# Patient Record
Sex: Male | Born: 1975 | Race: White | Hispanic: No | Marital: Married | Smoking: Never smoker
Health system: Southern US, Community
[De-identification: ages and names within clinical notes are randomized; demographics above are authoritative.]

## PROBLEM LIST (undated history)

## (undated) DIAGNOSIS — T7840XA Allergy, unspecified, initial encounter: Secondary | ICD-10-CM

## (undated) DIAGNOSIS — J309 Allergic rhinitis, unspecified: Secondary | ICD-10-CM

## (undated) DIAGNOSIS — J45909 Unspecified asthma, uncomplicated: Secondary | ICD-10-CM

## (undated) DIAGNOSIS — N200 Calculus of kidney: Secondary | ICD-10-CM

## (undated) DIAGNOSIS — R002 Palpitations: Secondary | ICD-10-CM

## (undated) HISTORY — DX: Allergy, unspecified, initial encounter: T78.40XA

## (undated) HISTORY — DX: Unspecified asthma, uncomplicated: J45.909

## (undated) HISTORY — DX: Calculus of kidney: N20.0

## (undated) HISTORY — PX: DERMOID CYST  EXCISION: SHX1452

## (undated) HISTORY — DX: Palpitations: R00.2

## (undated) HISTORY — DX: Allergic rhinitis, unspecified: J30.9

---

## 2010-04-19 ENCOUNTER — Ambulatory Visit: Payer: Self-pay | Admitting: Family Medicine

## 2011-07-01 IMAGING — US ABDOMEN ULTRASOUND LIMITED
1 series · 17 of 25 positions shown · non-contrast
Comparison: none

REASON FOR EXAM: persistent elevations of liver enzymes
COMMENTS:

[Series 1: abdomen ultrasound limited · 17 of 80 slices shown]
[im 1/80]
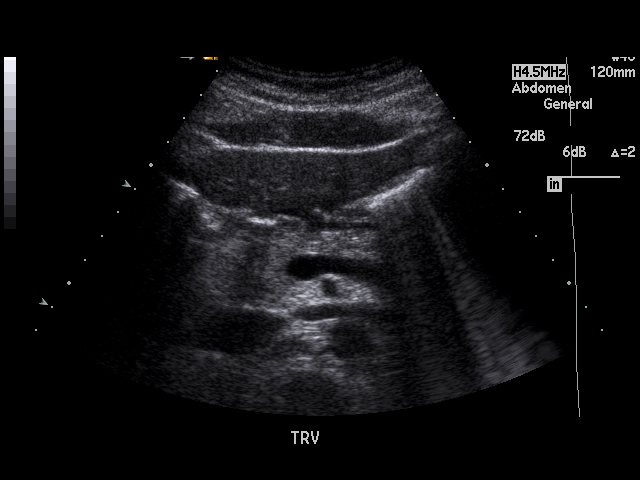
[im 7/80]
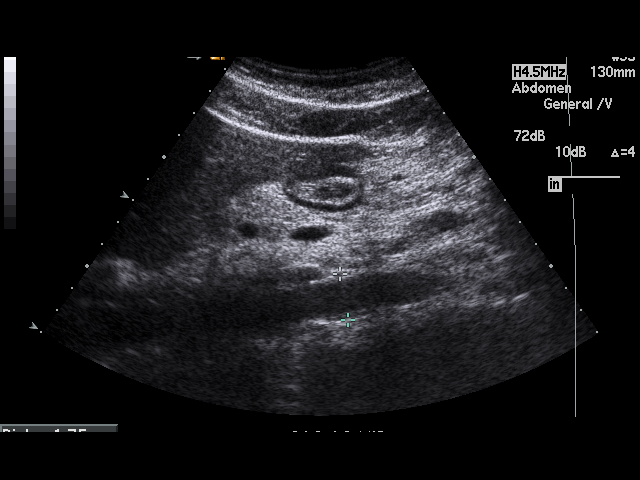
[im 10/80]
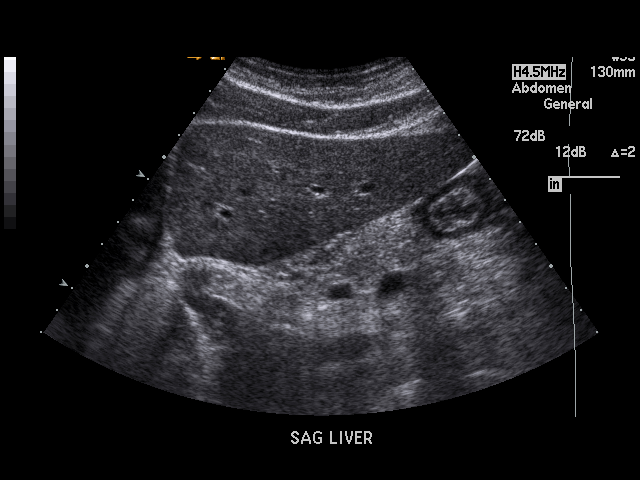
[im 17/80]
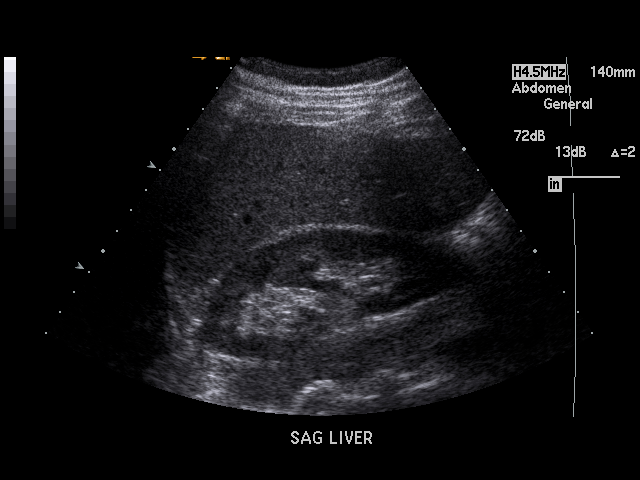
[im 20/80]
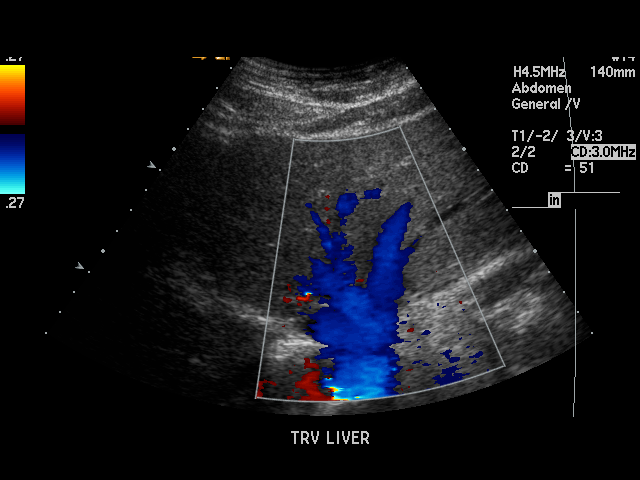
[im 27/80]
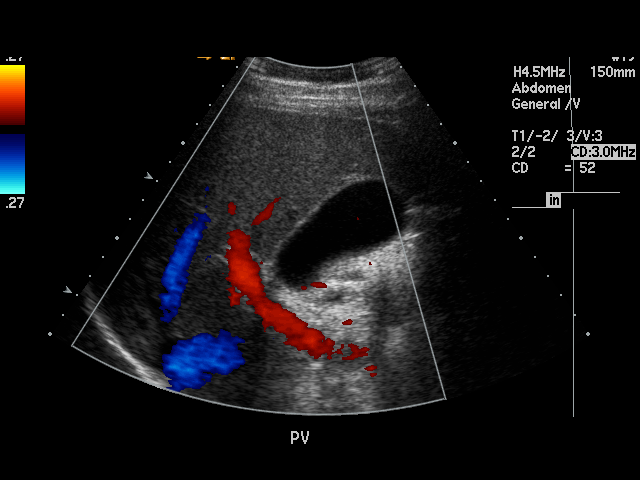
[im 30/80]
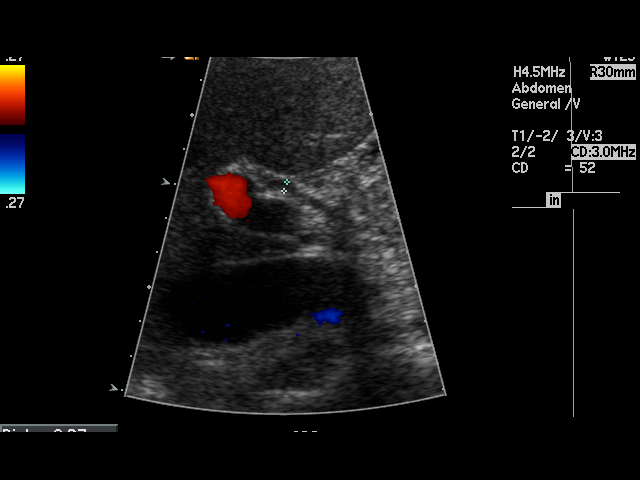
[im 37/80]
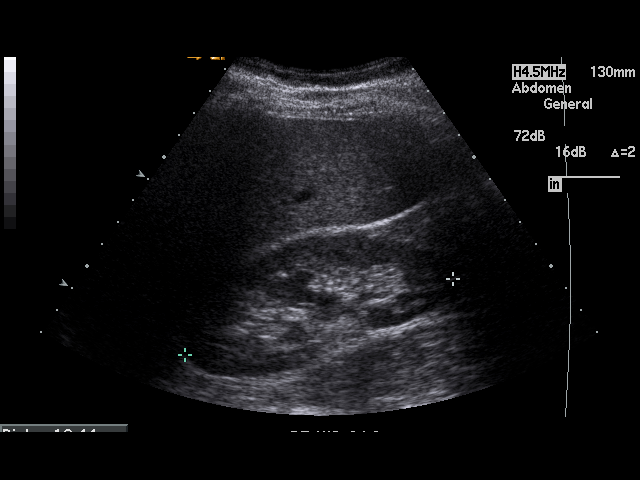
[im 40/80]
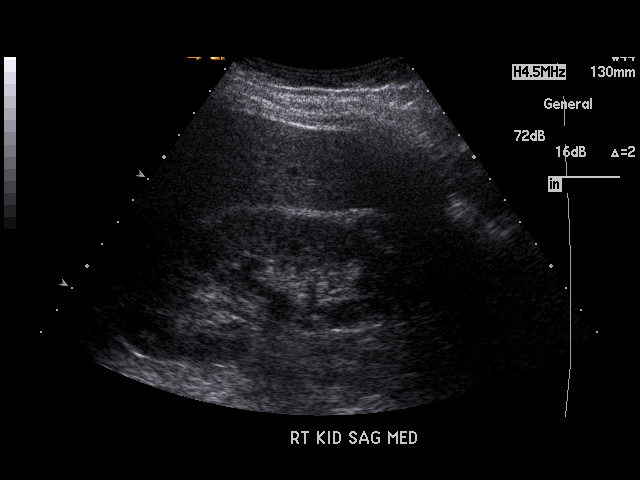
[im 43/80]
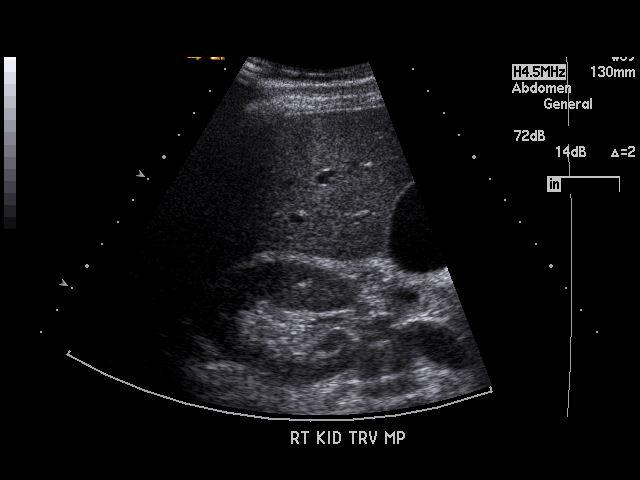
[im 50/80]
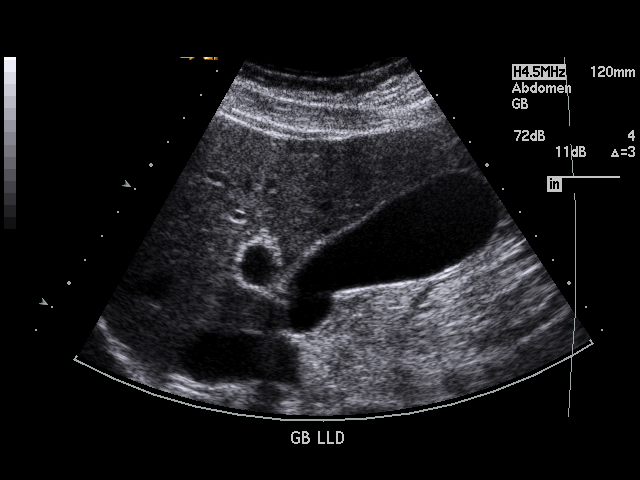
[im 53/80]
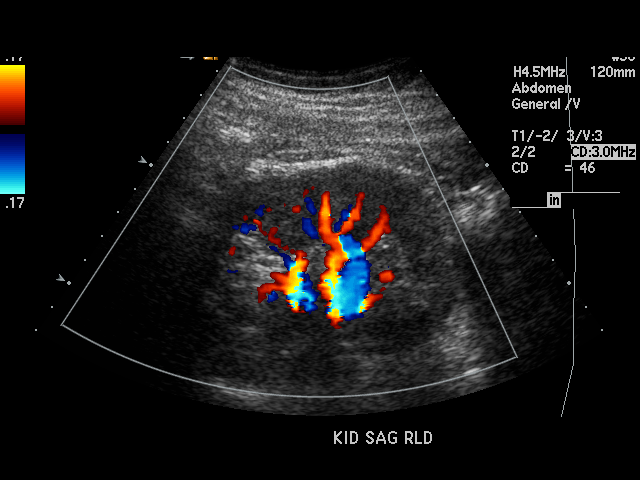
[im 60/80]
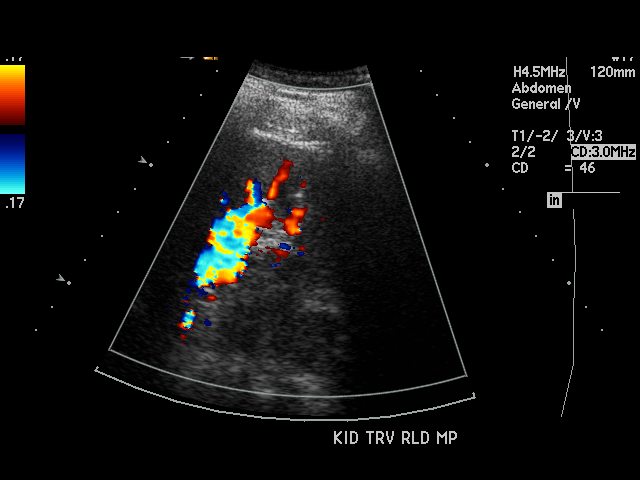
[im 63/80]
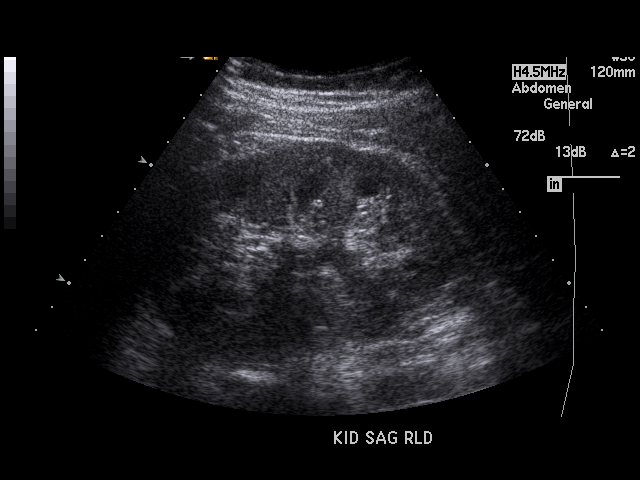
[im 70/80]
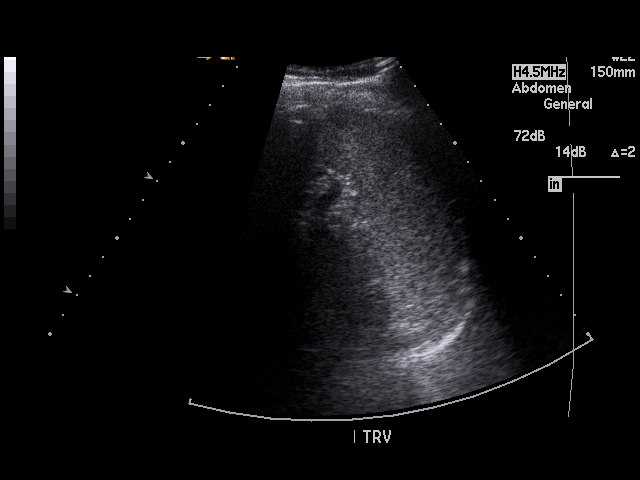
[im 73/80]
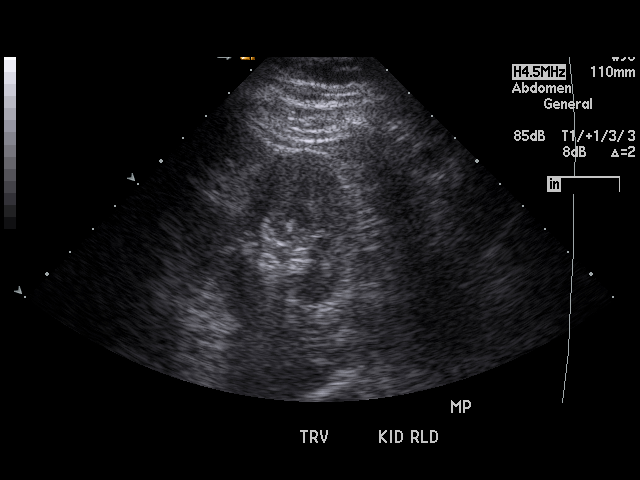
[im 80/80]
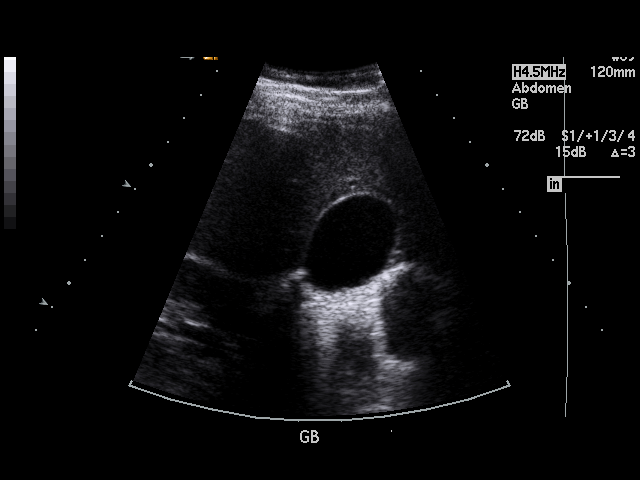

[17 of 25 positions shown; findings below may reference images not displayed]

PROCEDURE:     TIGER - TIGER ABDOMEN UPPER GENERAL  - April 19, 2010 [DATE]

RESULT:     The liver, spleen, abdominal aorta and inferior vena cava show
no significant abnormalities. A portion of the pancreatic tail is obscured
by bowel gas but otherwise the pancreas is normal in appearance. No
gallstones are seen. There is no thickening of the gallbladder wall. The
kidneys show no hydronephrosis. Sagittally, the right kidney measures
cm and the left measures 11.06 cm. No ascites is seen.
IMPRESSION: 1.     No significant abnormalities are noted.

## 2016-09-26 ENCOUNTER — Encounter: Payer: Self-pay | Admitting: Family Medicine

## 2016-09-26 ENCOUNTER — Ambulatory Visit (INDEPENDENT_AMBULATORY_CARE_PROVIDER_SITE_OTHER): Payer: BLUE CROSS/BLUE SHIELD | Admitting: Family Medicine

## 2016-09-26 VITALS — BP 110/82 | HR 67 | Temp 98.3°F | Ht 72.0 in | Wt 157.8 lb

## 2016-09-26 DIAGNOSIS — L989 Disorder of the skin and subcutaneous tissue, unspecified: Secondary | ICD-10-CM

## 2016-09-26 DIAGNOSIS — R7989 Other specified abnormal findings of blood chemistry: Secondary | ICD-10-CM

## 2016-09-26 DIAGNOSIS — R002 Palpitations: Secondary | ICD-10-CM

## 2016-09-26 DIAGNOSIS — J309 Allergic rhinitis, unspecified: Secondary | ICD-10-CM

## 2016-09-26 DIAGNOSIS — E559 Vitamin D deficiency, unspecified: Secondary | ICD-10-CM | POA: Diagnosis not present

## 2016-09-26 DIAGNOSIS — R945 Abnormal results of liver function studies: Principal | ICD-10-CM

## 2016-09-26 LAB — HEPATIC FUNCTION PANEL
ALK PHOS: 63 U/L (ref 39–117)
ALT: 47 U/L (ref 0–53)
AST: 51 U/L — ABNORMAL HIGH (ref 0–37)
Albumin: 4.7 g/dL (ref 3.5–5.2)
BILIRUBIN DIRECT: 0.1 mg/dL (ref 0.0–0.3)
TOTAL PROTEIN: 7.9 g/dL (ref 6.0–8.3)
Total Bilirubin: 0.8 mg/dL (ref 0.2–1.2)

## 2016-09-26 LAB — IRON AND TIBC
%SAT: 70 % — ABNORMAL HIGH (ref 15–60)
Iron: 191 ug/dL — ABNORMAL HIGH (ref 50–180)
TIBC: 271 ug/dL (ref 250–425)
UIBC: 80 ug/dL

## 2016-09-26 LAB — TRANSFERRIN: TRANSFERRIN: 218 mg/dL (ref 212.0–360.0)

## 2016-09-26 LAB — FERRITIN: Ferritin: 353 ng/mL — ABNORMAL HIGH (ref 22.0–322.0)

## 2016-09-26 MED ORDER — FLUTICASONE PROPIONATE 50 MCG/ACT NA SUSP: 2.0000 | Freq: Every day | NASAL | 6 refills | Status: DC

## 2016-09-26 NOTE — Assessment & Plan Note (Signed)
Patient with very infrequent palpitations that he describes as a fluttering that lasts up to a second. Resolves on its own. No other symptoms with it. Suspect PVCs given short duration and description. They do not happen frequently enough for them to be effectively evaluated with Holter monitor or event monitor. They're not bothersome to the patient. Discussed monitoring them and if they become more frequent or if they are persistent he should be reevaluated.

## 2016-09-26 NOTE — Assessment & Plan Note (Signed)
Chronic intermittent elevation. Had an ultrasound previously that was unremarkable. Discussed obtaining lab work as outlined below to evaluate for other potential causes. We'll recheck LFTs as well.

## 2016-09-26 NOTE — Assessment & Plan Note (Signed)
Patient recently started on multivitamin. He'll continue this and will plan on rechecking in 2 months.

## 2016-09-26 NOTE — Progress Notes (Signed)
[name], MD Phone: [phone]  Cole Lane is a 41 y.o. male who presents today for new patient visit.  Patient notes for the last 6-7 years he's had issues with intermittent nasal and sinus congestion. Does note some mild postnasal drip at times. No ear discomfort. Not blowing anything out of his nose. Some sneezing. Does note allergic conjunctivitis in the spring. He notes he can feel the congestion move if he tilts his head. He notes some sinus headaches related to this. Notes a history of a deviated septum. Only has headaches 1-2 times a month. No numbness or weakness. He has not tried any medications other than Claritin just in the spring. He does prop his head up at night and this helps.  History of elevated LFTs: Patient notes intermittently for some time now his LFTs have been slightly elevated. Sometimes they will be normal and sometimes will be slightly elevated. He had an ultrasound 5-6 years ago that was unremarkable. He notes no alcohol intake or Tylenol use.  Patient notes for most of his life he will rarely feel a flutter in his chest that will last half a second to 1 second. Occurs about every 6 months. It does not persist when it does occur. Has not worsened. He notes no chest pain with this. Denies trouble breathing. No other symptoms with it. He had an EKG 2-3 years ago when evaluated at [facility] walk-in clinic for what appears to be bronchitis and the EKG read appears to be normal sinus rhythm without ST or T-wave changes. He's also had intermittent lab work that has not revealed a specific cause for this.  Patient notes for the last 2 years he's noted some issues with bumps on his face. They become more prevalent with time. They do itch occasionally.  Active Ambulatory Problems    Diagnosis Date Noted  . Elevated LFTs 09/26/2016  . Skin lesions 09/26/2016  . Palpitations 09/26/2016  . Allergic rhinitis 09/26/2016  . Vitamin D deficiency 09/26/2016   Resolved  Ambulatory Problems    Diagnosis Date Noted  . No Resolved Ambulatory Problems   Past Medical History:  Diagnosis Date  . Allergic rhinitis   . Asthma   . Kidney stone   . Palpitation     Family History  Problem Relation Age of Onset  . Asthma Maternal Aunt   . Hypertension Other        Parent  . Stroke Other        Grandparent  . Mental illness Other        Grandparent  . Arthritis Other        Grandparent    Social History   Social History  . Marital status: Single    Spouse name: N/A  . Number of children: N/A  . Years of education: N/A   Occupational History  . Not on file.   Social History Main Topics  . Smoking status: Never Smoker  . Smokeless tobacco: Never Used  . Alcohol use No  . Drug use: No  . Sexual activity: Not on file   Other Topics Concern  . Not on file   Social History Narrative  . No narrative on file    ROS  General:  Negative for nexplained weight loss, fever Skin: Negative for new or changing mole, sore that won't heal HEENT: Negative for trouble hearing, trouble seeing, ringing in ears, mouth sores, hoarseness, change in voice, dysphagia. CV:  Positive for palpitations, Negative for chest pain, dyspnea,  edema Resp: Negative for cough, dyspnea, hemoptysis GI: Negative for nausea, vomiting, diarrhea, constipation, abdominal pain, melena, hematochezia. GU: Negative for dysuria, incontinence, urinary hesitance, hematuria, vaginal or penile discharge, polyuria, sexual difficulty, lumps in testicle or breasts MSK: Negative for muscle cramps or aches, joint pain or swelling Neuro: Positive for headaches, negative for weakness, numbness, dizziness, passing out/fainting Psych: Negative for depression, anxiety, memory problems  Objective  Physical Exam Vitals:   09/26/16 1041  BP: 110/82  Pulse: 67  Temp: 98.3 F (36.8 C)    BP Readings from Last 3 Encounters:  09/26/16 110/82   Wt Readings from Last 3 Encounters:  09/26/16  157 lb 12.8 oz (71.6 kg)    Physical Exam  Constitutional: No distress.  HENT:  Head: Normocephalic and atraumatic.  Mouth/Throat: Oropharynx is clear and moist. No oropharyngeal exudate.  Bilateral TMs obscured by cerumen  Eyes: Pupils are equal, round, and reactive to light. Conjunctivae are normal.  Neck: Neck supple.  Cardiovascular: Normal rate, regular rhythm and normal heart sounds.   Pulmonary/Chest: Effort normal and breath sounds normal.  Abdominal: Soft. Bowel sounds are normal. He exhibits no distension. There is no tenderness. There is no rebound and no guarding.  Musculoskeletal: He exhibits no edema.  Lymphadenopathy:    He has no cervical adenopathy.  Neurological: He is alert. Gait normal.  Skin: Skin is warm and dry. He is not diaphoretic.  Scattered skin colored papules on bilateral cheeks and forehead  Psychiatric: Mood and affect normal.     Assessment/Plan:   Elevated LFTs Chronic intermittent elevation. Had an ultrasound previously that was unremarkable. Discussed obtaining lab work as outlined below to evaluate for other potential causes. We'll recheck LFTs as well.  Skin lesions Refer to dermatology.  Palpitations Patient with very infrequent palpitations that he describes as a fluttering that lasts up to a second. Resolves on its own. No other symptoms with it. Suspect PVCs given short duration and description. They do not happen frequently enough for them to be effectively evaluated with Holter monitor or event monitor. They're not bothersome to the patient. Discussed monitoring them and if they become more frequent or if they are persistent he should be reevaluated.  Allergic rhinitis Suspect sinus symptoms are related to allergies. Unlikely infectious given duration of symptoms. Advised on using Flonase and Claritin. If not improving could have him see ENT for evaluation.  Vitamin D deficiency Patient recently started on multivitamin. He'll continue  this and will plan on rechecking in 2 months.   Orders Placed This Encounter  Procedures  . Hepatic function panel  . Hepatitis B Core Antibody, total  . Hepatitis B Surface AntiBODY  . Hepatitis B Surface AntiGEN  . Hepatitis A Ab, Total  . Hepatitis C Antibody  . Ferritin  . Iron and TIBC  . Transferrin  . Ambulatory referral to Dermatology    Referral Priority:   Routine    Referral Type:   Consultation    Referral Reason:   Specialty Services Required    Requested Specialty:   Dermatology    Number of Visits Requested:   1    Meds ordered this encounter  Medications  . Multiple Vitamin (MULTIVITAMIN) capsule    Sig: Take 1 capsule by mouth daily.  Marland Kitchen ibuprofen (ADVIL,MOTRIN) 200 MG tablet    Sig: Take 200 mg by mouth every 6 (six) hours as needed.  . fluticasone (FLONASE) 50 MCG/ACT nasal spray    Sig: Place 2 sprays into both nostrils  daily.    Dispense:  16 g    Refill:  Harrison, MD [place]

## 2016-09-26 NOTE — Assessment & Plan Note (Signed)
Suspect sinus symptoms are related to allergies. Unlikely infectious given duration of symptoms. Advised on using Flonase and Claritin. If not improving could have him see ENT for evaluation.

## 2016-09-26 NOTE — Assessment & Plan Note (Signed)
Refer to dermatology 

## 2016-09-26 NOTE — Patient Instructions (Signed)
Nice to meet you. Please start using Flonase and Claritin for your sinus issues. If this does not improve your symptoms please let us know so we can have you see ENT. We'll refer you to dermatology. We'll check lab work today and contact you with the results. If you have more frequent fluttering or it is persistent you should be reevaluated.

## 2016-09-27 ENCOUNTER — Telehealth: Payer: Self-pay | Admitting: *Deleted

## 2016-09-27 LAB — HEPATITIS B CORE ANTIBODY, TOTAL: Hep B Core Total Ab: NONREACTIVE

## 2016-09-27 LAB — HEPATITIS B SURFACE ANTIGEN: Hepatitis B Surface Ag: NONREACTIVE

## 2016-09-27 LAB — HEPATITIS B SURFACE ANTIBODY,QUALITATIVE: Hep B S Ab: NONREACTIVE

## 2016-09-27 LAB — HEPATITIS C ANTIBODY: HCV Ab: NONREACTIVE

## 2016-09-27 LAB — HEPATITIS A ANTIBODY, TOTAL: Hep A Total Ab: NONREACTIVE

## 2016-09-27 NOTE — Telephone Encounter (Signed)
Patient aware of results. See result note.  

## 2016-09-27 NOTE — Telephone Encounter (Signed)
Pt requested lab results  Pt contact [phone]

## 2016-09-28 ENCOUNTER — Other Ambulatory Visit: Payer: Self-pay | Admitting: Family Medicine

## 2016-10-02 ENCOUNTER — Other Ambulatory Visit (INDEPENDENT_AMBULATORY_CARE_PROVIDER_SITE_OTHER): Payer: BLUE CROSS/BLUE SHIELD

## 2016-10-02 DIAGNOSIS — R790 Abnormal level of blood mineral: Secondary | ICD-10-CM | POA: Diagnosis not present

## 2016-10-06 LAB — HEMOCHROMATOSIS DNA-PCR(C282Y,H63D)

## 2016-10-14 ENCOUNTER — Other Ambulatory Visit: Payer: Self-pay | Admitting: Family Medicine

## 2016-10-14 DIAGNOSIS — R945 Abnormal results of liver function studies: Principal | ICD-10-CM

## 2016-10-14 DIAGNOSIS — R7989 Other specified abnormal findings of blood chemistry: Secondary | ICD-10-CM

## 2016-10-17 DIAGNOSIS — L821 Other seborrheic keratosis: Secondary | ICD-10-CM | POA: Diagnosis not present

## 2016-10-17 DIAGNOSIS — D225 Melanocytic nevi of trunk: Secondary | ICD-10-CM | POA: Diagnosis not present

## 2016-10-17 DIAGNOSIS — L738 Other specified follicular disorders: Secondary | ICD-10-CM | POA: Diagnosis not present

## 2016-10-17 DIAGNOSIS — L988 Other specified disorders of the skin and subcutaneous tissue: Secondary | ICD-10-CM | POA: Diagnosis not present

## 2016-10-27 ENCOUNTER — Telehealth: Payer: Self-pay | Admitting: Family Medicine

## 2016-10-27 NOTE — Telephone Encounter (Signed)
Pt called and stated that they he got a denial on some lab work from [employer] of 10/02/16. Pt states that it was for some genetic testing for hemochromatosis. Pt states that if we show that it was medically necessary it would be covered by insurance. Looks like from previous labs Dr. Caryl Bis had a medically necessary reason. Pt states that the insurance company told him that there was no diagnosis code or reason as to why this test was needed. Please advise, thank you!  Call Pt @ [phone]

## 2016-10-30 ENCOUNTER — Encounter (INDEPENDENT_AMBULATORY_CARE_PROVIDER_SITE_OTHER): Payer: Self-pay

## 2016-10-30 ENCOUNTER — Encounter: Payer: Self-pay | Admitting: Oncology

## 2016-10-30 ENCOUNTER — Inpatient Hospital Stay: Payer: BLUE CROSS/BLUE SHIELD

## 2016-10-30 ENCOUNTER — Inpatient Hospital Stay: Payer: BLUE CROSS/BLUE SHIELD | Attending: Oncology | Admitting: Oncology

## 2016-10-30 DIAGNOSIS — R42 Dizziness and giddiness: Secondary | ICD-10-CM | POA: Insufficient documentation

## 2016-10-30 DIAGNOSIS — Z880 Allergy status to penicillin: Secondary | ICD-10-CM | POA: Insufficient documentation

## 2016-10-30 DIAGNOSIS — Z79899 Other long term (current) drug therapy: Secondary | ICD-10-CM | POA: Insufficient documentation

## 2016-10-30 DIAGNOSIS — Z87442 Personal history of urinary calculi: Secondary | ICD-10-CM

## 2016-10-30 DIAGNOSIS — J45909 Unspecified asthma, uncomplicated: Secondary | ICD-10-CM

## 2016-10-30 LAB — COMPREHENSIVE METABOLIC PANEL
ALBUMIN: 4.9 g/dL (ref 3.5–5.0)
ALT: 50 U/L (ref 17–63)
AST: 60 U/L — ABNORMAL HIGH (ref 15–41)
Alkaline Phosphatase: 72 U/L (ref 38–126)
Anion gap: 8 (ref 5–15)
BUN: 12 mg/dL (ref 6–20)
CO2: 28 mmol/L (ref 22–32)
CREATININE: 0.9 mg/dL (ref 0.61–1.24)
Calcium: 9.9 mg/dL (ref 8.9–10.3)
Chloride: 100 mmol/L — ABNORMAL LOW (ref 101–111)
GFR calc Af Amer: >60 mL/min (ref >60)
Glucose, Bld: 90 mg/dL (ref 65–99)
POTASSIUM: 4.2 mmol/L (ref 3.5–5.1)
SODIUM: 136 mmol/L (ref 135–145)
Total Bilirubin: 0.9 mg/dL (ref 0.3–1.2)
Total Protein: 8.2 g/dL — ABNORMAL HIGH (ref 6.5–8.1)

## 2016-10-30 LAB — CBC WITH DIFFERENTIAL/PLATELET
BASOS ABS: 0.1 10*3/uL (ref 0–0.1)
BASOS PCT: 1 %
EOS ABS: 0.1 10*3/uL (ref 0–0.7)
EOS PCT: 2 %
HCT: 42.7 % (ref 40.0–52.0)
Hemoglobin: 14.7 g/dL (ref 13.0–18.0)
Lymphocytes Relative: 27 %
Lymphs Abs: 1.9 10*3/uL (ref 1.0–3.6)
MCH: 30.9 pg (ref 26.0–34.0)
MCHC: 34.5 g/dL (ref 32.0–36.0)
MCV: 89.4 fL (ref 80.0–100.0)
MONO ABS: 0.6 10*3/uL (ref 0.2–1.0)
Monocytes Relative: 9 %
NEUTROS ABS: 4.3 10*3/uL (ref 1.4–6.5)
Neutrophils Relative %: 61 %
PLATELETS: 219 10*3/uL (ref 150–440)
RBC: 4.78 MIL/uL (ref 4.40–5.90)
RDW: 12.4 % (ref 11.5–14.5)
WBC: 6.9 10*3/uL (ref 3.8–10.6)

## 2016-10-30 LAB — FERRITIN: FERRITIN: 383 ng/mL — AB (ref 24–336)

## 2016-10-30 LAB — IRON AND TIBC
IRON: 137 ug/dL (ref 45–182)
Saturation Ratios: 46 % — ABNORMAL HIGH (ref 17.9–39.5)
TIBC: 300 ug/dL (ref 250–450)
UIBC: 163 ug/dL

## 2016-10-30 NOTE — Telephone Encounter (Signed)
I spoke with [employer] rep Santiago Glad to change the patient's diagnosis code from E83.19 to R79.0.  She has submitted the claim to billing to resubmit it under the new change in diagnosis code.The processing may take up to 30-45 days. If the patient receives a bill within the next couple of days he should ignore it , due to [place] just receiving the change of diagnosis code today. Dr. Caryl Bis agreed to the change in code. The patient has been notified of the resubmission to the insurance company.

## 2016-10-30 NOTE — Progress Notes (Signed)
Patient here today as a new patient  

## 2016-10-30 NOTE — Addendum Note (Signed)
Addended by: Earlie Server on: 10/30/2016 01:48 PM   Modules accepted: Orders

## 2016-10-30 NOTE — Progress Notes (Signed)
Hematology/Oncology Consult note [facility] Telephone:([phone] [phone]  CONSULT NOTE Patient Care Team: [name], MD as PCP - General (Family Medicine)  Referring Physician Dr.Sonnernberg.Eric CHIEF COMPLAINTS/PURPOSE OF CONSULTATION:  I have high iron level   HISTORY OF PRESENTING ILLNESS:  Cole Lane 41 y.o.  male with past medical history listed as per low who was referred by Dr. 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 Patient denies any family history of hemochromatosis. He is eating history his health state. He feels well at baseline except sometimes if he stands up pretty fast for dizziness. He has been following up with primary care physician and has been noticed that his liver function tests has been mildly elevated. Additional workup showed high ferritin 353, also high transferrin saturation of 70%. 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 He is married and has no children yet.   ROS:  Review of Systems  Constitutional: Negative.   HENT:  Negative.   Eyes: Negative.   Respiratory: Negative.   Cardiovascular: Negative.   Gastrointestinal: Negative.   Endocrine: Negative.   Genitourinary: Negative.    Musculoskeletal: Negative.   Skin: Negative.   Neurological: Negative.   Hematological: Negative.   Psychiatric/Behavioral: Negative.     MEDICAL HISTORY:  Past Medical History:  Diagnosis Date  . Allergic rhinitis   . Asthma   . Kidney stone   . Palpitation     SURGICAL HISTORY: Past Surgical History:  Procedure Laterality Date  . DERMOID CYST  EXCISION      SOCIAL HISTORY: Social History   Social History  . Marital status: Single    Spouse name: N/A  . Number of children: N/A  . Years of education: N/A   Occupational History  . Not on file.   Social History Main Topics  . Smoking status: Never Smoker    . Smokeless tobacco: Never Used  . Alcohol use No  . Drug use: No  . Sexual activity: Yes   Other Topics Concern  . Not on file   Social History Narrative  . No narrative on file    FAMILY HISTORY: Family History  Problem Relation Age of Onset  . Asthma Maternal Aunt   . Hypertension Other        Parent  . Stroke Other        Grandparent  . Mental illness Other        Grandparent  . Arthritis Other        Grandparent    ALLERGIES:  is allergic to penicillin g.  MEDICATIONS:  Current Outpatient Prescriptions  Medication Sig Dispense Refill  . fluticasone (FLONASE) 50 MCG/ACT nasal spray Place 2 sprays into both nostrils daily. 16 g 6  . ibuprofen (ADVIL,MOTRIN) 200 MG tablet Take 200 mg by mouth every 6 (six) hours as needed.    . Multiple Vitamin (MULTIVITAMIN) capsule Take 1 capsule by mouth daily.     No current facility-administered medications for this visit.       Marland Kitchen  PHYSICAL EXAMINATION: ECOG PERFORMANCE STATUS: 0 - Asymptomatic Vitals:   10/30/16 1025  BP: 110/73  Pulse: 98  Resp: 18  Temp: 97.8 F (36.6 C)   Filed Weights   10/30/16 1025  Weight: 154 lb 1 oz (69.9 kg)    GENERAL: Well-nourished well-developed; Alert, no distress and comfortable.  EYES: no pallor or icterus OROPHARYNX: no thrush or ulceration; good dentition  NECK: supple, no  masses felt LYMPH:  no palpable lymphadenopathy in the cervical, axillary or inguinal regions LUNGS: clear to auscultation and  No wheeze or crackles HEART/CVS: regular rate & rhythm and no murmurs; No lower extremity edema ABDOMEN: abdomen soft, non-tender and normal bowel sounds Musculoskeletal:no cyanosis of digits and no clubbing  PSYCH: alert & oriented x 3  NEURO: no focal motor/sensory deficits SKIN:  no rashes or significant lesions  LABORATORY DATA:  I have reviewed the data as listed No results found for: WBC, HGB, HCT, MCV, PLT  Recent Labs  09/26/16 1115 10/30/16 1103  NA  --  136   K  --  4.2  CL  --  100*  CO2  --  28  GLUCOSE  --  90  BUN  --  12  CREATININE  --  0.90  CALCIUM  --  9.9  GFRNONAA  --  >60  GFRAA  --  >60  PROT 7.9 8.2*  ALBUMIN 4.7 4.9  AST 51* 60*  ALT 47 50  ALKPHOS 63 72  BILITOT 0.8 0.9  BILIDIR 0.1  --    Iron/TIBC/Ferritin/ %Sat    Component Value Date/Time   IRON 137 10/30/2016 1103   TIBC 300 10/30/2016 1103   FERRITIN 383 (H) 10/30/2016 1103   IRONPCTSAT 46 (H) 10/30/2016 1103   IRONPCTSAT 70 (H) 09/26/2016 1115   Hepatitis Panel all negative.   DNA Mutation Analysis              RESULT: COMPOUND HETEROZYGOUS FOR THE C282Y AND  H63D MUTATIONS           RADIOGRAPHIC STUDIES: I have personally reviewed the radiological images as listed and agreed with the findings in the report. No results found.  ASSESSMENT & PLAN:  1. Hereditary hemochromatosis ([place])   2. 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  Obtain baseline CBC, repeat iron test and liver function test today. Plan Phlebotomy 2 units  All questions were answered. The patient knows to call the clinic with any problems questions or concerns.  Return of visit: 3 weeks with labs (cbc, iron TIBC ferritin and possible phlebotomy Thank you for this kind referral and the opportunity to participate in the care of this patient. A copy of today's note is routed to referring provider Dr.Sonnenberg.    Earlie Server, MD, PhD Hematology Oncology [facility] Pager- [phone] 10/30/2016

## 2016-10-31 ENCOUNTER — Inpatient Hospital Stay: Payer: BLUE CROSS/BLUE SHIELD

## 2016-10-31 DIAGNOSIS — R42 Dizziness and giddiness: Secondary | ICD-10-CM | POA: Diagnosis not present

## 2016-10-31 DIAGNOSIS — Z880 Allergy status to penicillin: Secondary | ICD-10-CM | POA: Diagnosis not present

## 2016-10-31 DIAGNOSIS — J45909 Unspecified asthma, uncomplicated: Secondary | ICD-10-CM | POA: Diagnosis not present

## 2016-10-31 DIAGNOSIS — Z79899 Other long term (current) drug therapy: Secondary | ICD-10-CM | POA: Diagnosis not present

## 2016-10-31 DIAGNOSIS — Z87442 Personal history of urinary calculi: Secondary | ICD-10-CM | POA: Diagnosis not present

## 2016-10-31 LAB — HEMOGLOBIN A1C
Hgb A1c MFr Bld: 5.3 % (ref 4.8–5.6)
Mean Plasma Glucose: 105 mg/dL

## 2016-10-31 MED ORDER — SODIUM CHLORIDE 0.9 % IV SOLN
Freq: Once | INTRAVENOUS | Status: DC
Start: 2016-10-31 — End: 2016-10-31
  Filled 2016-10-31: qty 1000

## 2016-11-21 ENCOUNTER — Ambulatory Visit (INDEPENDENT_AMBULATORY_CARE_PROVIDER_SITE_OTHER): Payer: BLUE CROSS/BLUE SHIELD | Admitting: Gastroenterology

## 2016-11-21 ENCOUNTER — Encounter: Payer: Self-pay | Admitting: Gastroenterology

## 2016-11-21 NOTE — Progress Notes (Signed)
[name] MD, MRCP(U.K) [address]  [place]  [place], [place] [address]  Main: [phone]  Fax: [phone]   Gastroenterology Consultation  Referring Provider:     [name], MD Primary Care Physician:  [name], MD Primary Gastroenterologist:  Dr. [name]  Reason for Consultation:     Abnormal LFT's         HPI:   Cole Lane is a 41 y.o. y/o male referred for consultation & management  by Dr. Caryl Bis, Angela Adam, MD.    He has been referred for abnormal LFT's  Patient has been seen by Dr Tasia Catchings in Hematology on 10/30/16 for a referal for compound heterozygoys  GenesC282Y/HD 63 mutation.   for hemochromatosis. He is undergoing phlebotomy. Hep Bcab, Hbsab/ag,Hep A ab , HCV ab -negative . Ferritin 353, TIBC 70%, Hb 14.7, Platelet 219   First noted abnormality in LFT's in 09/26/16 . AST 51 otherwise all tests were normal . Says 7 years back his liver enzymes were slightly elevated  Alcohol use none *  Drug use none  Over the counter herbal supplements none  New medications multivitamins, flonase  Abdominal pain no  Tattoos no  Military service no  Prior blood transfusion no  Incarceration no  History of travel : [place] (consumed raw fish) Family history of liver disease none ,  Recent change in weight : none  Blood transfusions none   ?Weakness and lethargy - possible ?Skin hyperpigmentation - \ no  ?Diabetes mellitus -no  ?Arthralgia -yes in fingers and toes  ?Impotence in males - none  ?Electrocardiographic abnormalities - normal - occasional flutter of the heart.   Past Medical History:  Diagnosis Date  . Allergic rhinitis   . Allergy   . Asthma   . Kidney stone   . Palpitation     Past Surgical History:  Procedure Laterality Date  . DERMOID CYST  EXCISION      Prior to Admission medications   Medication Sig Start Date End Date Taking? Authorizing Provider  fluticasone (FLONASE) 50 MCG/ACT nasal spray Place 2 sprays into  both nostrils daily. 09/26/16  Yes [name], MD  ibuprofen (ADVIL,MOTRIN) 200 MG tablet Take 200 mg by mouth every 6 (six) hours as needed.   Yes [provider]  Multiple Vitamin (MULTIVITAMIN) capsule Take 1 capsule by mouth daily.   Yes [provider]    Family History  Problem Relation Age of Onset  . Asthma Maternal Aunt   . Hypertension Other        Parent  . Stroke Other        Grandparent  . Mental illness Other        Grandparent  . Arthritis Other        Grandparent  . Hypertension Father      Social History  Substance Use Topics  . Smoking status: Never Smoker  . Smokeless tobacco: Never Used  . Alcohol use No    Allergies as of 11/21/2016 - Review Complete 11/21/2016  Allergen Reaction Noted  . Penicillin g Other (See Comments) 11/17/2013    Review of Systems:    All systems reviewed and negative except where noted in HPI.   Physical Exam:  BP 122/75 (BP Location: Left Arm, Patient Position: Sitting, Cuff Size: Normal)   Pulse 72   Temp 97.6 F (36.4 C) (Oral)   Ht 5' 11.75" (1.822 m)   Wt 156 lb 12.8 oz (71.1 kg)  BMI 21.41 kg/m  No LMP for male patient. Psych:  Alert and cooperative. Normal mood and affect. General:   Alert,  Well-developed, well-nourished, pleasant and cooperative in NAD Head:  Normocephalic and atraumatic. Eyes:  Sclera clear, no icterus.   Conjunctiva pink. Ears:  Normal auditory acuity. Nose:  No deformity, discharge, or lesions. Mouth:  No deformity or lesions,oropharynx pink & moist. Neck:  Supple; no masses or thyromegaly. Lungs:  Respirations even and unlabored.  Clear throughout to auscultation.   No wheezes, crackles, or rhonchi. No acute distress. Heart:  Regular rate and rhythm; no murmurs, clicks, rubs, or gallops. Abdomen:  Normal bowel sounds.  No bruits.  Soft, non-tender and non-distended without masses, hepatosplenomegaly or hernias noted.  No guarding or rebound tenderness.      Neurologic:  Alert and oriented x3;  grossly normal neurologically. Skin:  Intact without significant lesions or rashes. No jaundice. Lymph Nodes:  No significant cervical adenopathy. Psych:  Alert and cooperative. Normal mood and affect.  Imaging Studies: No results found.  Assessment and Plan:   Cole Lane is a 41 y.o. y/o male has been referred for hereditary hemochromatosis  with a C282Y/H63D genotype ,  60 % have an intermediate degree of iron loading, and 35 percent have normal iron burdens. Patients who are  asymptomatic HH and ferritins <500 are at low risk for developing HH-related signs and symptoms.    Plan: 1. Check Hep A ab if negative will need Hep A/B vaccine  2. Tdap, flu, pneumonia vaccine with Dr Caryl Bis 3.USG abdomen to evaluate for portal hypertension (less likely as he has normal albumin, platelet count 4. Check INR 5. HIV test, check for other autoimmune diseases  6. Limit the intake of ethanol, avoid taking iron, vitamin C supplements, and avoid uncooked seafood 7. Phlebotomy per hematology recommendations 8. A fasting transferrin saturation and ferritin concentration, along with testing for mutations in the HFE gene, should be obtained in all first-degree relatives as part of screening    Follow up in 4-5 months   Dr [name] MD,MRCP(U.K)

## 2016-11-22 ENCOUNTER — Inpatient Hospital Stay: Payer: BLUE CROSS/BLUE SHIELD | Attending: Oncology | Admitting: Oncology

## 2016-11-22 ENCOUNTER — Inpatient Hospital Stay: Payer: BLUE CROSS/BLUE SHIELD

## 2016-11-22 ENCOUNTER — Encounter: Payer: Self-pay | Admitting: Gastroenterology

## 2016-11-22 ENCOUNTER — Encounter: Payer: Self-pay | Admitting: Oncology

## 2016-11-22 DIAGNOSIS — Z79899 Other long term (current) drug therapy: Secondary | ICD-10-CM | POA: Insufficient documentation

## 2016-11-22 DIAGNOSIS — J45909 Unspecified asthma, uncomplicated: Secondary | ICD-10-CM | POA: Diagnosis not present

## 2016-11-22 LAB — CBC WITH DIFFERENTIAL/PLATELET
BASOS ABS: 0 10*3/uL (ref 0–0.1)
BASOS PCT: 1 %
EOS ABS: 0.1 10*3/uL (ref 0–0.7)
EOS PCT: 2 %
HCT: 38.7 % — ABNORMAL LOW (ref 40.0–52.0)
Hemoglobin: 13.5 g/dL (ref 13.0–18.0)
Lymphocytes Relative: 25 %
Lymphs Abs: 1.3 10*3/uL (ref 1.0–3.6)
MCH: 31.6 pg (ref 26.0–34.0)
MCHC: 34.9 g/dL (ref 32.0–36.0)
MCV: 90.5 fL (ref 80.0–100.0)
MONO ABS: 0.6 10*3/uL (ref 0.2–1.0)
Monocytes Relative: 11 %
Neutro Abs: 3.4 10*3/uL (ref 1.4–6.5)
Neutrophils Relative %: 63 %
PLATELETS: 182 10*3/uL (ref 150–440)
RBC: 4.27 MIL/uL — AB (ref 4.40–5.90)
RDW: 12.6 % (ref 11.5–14.5)
WBC: 5.4 10*3/uL (ref 3.8–10.6)

## 2016-11-22 LAB — IRON AND TIBC
IRON: 32 ug/dL — AB (ref 45–182)
Saturation Ratios: 11 % — ABNORMAL LOW (ref 17.9–39.5)
TIBC: 285 ug/dL (ref 250–450)
UIBC: 253 ug/dL

## 2016-11-22 LAB — FERRITIN: Ferritin: 198 ng/mL (ref 24–336)

## 2016-11-22 NOTE — Progress Notes (Signed)
Hematology/Oncology Follow Up Visit [facility] [phone] [phone]   Patient Care Team: [name], MD as PCP - General (Family Medicine)  Referring Physician Dr.Sonnernberg.Eric  REASON FOR VISIT Follow up for treatment of hemochromatosis and iron overload  PERTINENT HEMATOLOGY HISTORY Cole Lane 41 y.o.  male with past medical history listed as per low who was referred by Dr. 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 Patient denies any family history of hemochromatosis. He is eating history his health state. He feels well at baseline except sometimes if he stands up pretty fast for dizziness. He has been following up with primary care physician and has been noticed that his liver function tests has been mildly elevated. Additional workup showed high ferritin 353, also high transferrin saturation of 70%. 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 He is married and has no children yet.   INTERVAL HISTORY Cole Lane is a 41 y.o. male who has above history reviewed by me today presents for follow up visit for management of hemochromatosis. He has got phlebotomy for 2 units during the interval, tolerate well. He has also seen Dr.Anna for elevated LFTs. He is undergoing work up including lab test and US abdomen.  Denies any complaint today.    ROS:  Review of Systems  Constitutional: Negative.   HENT:  Negative.   Eyes: Negative.   Respiratory: Negative.   Cardiovascular: Negative.   Gastrointestinal: Negative.   Endocrine: Negative.   Genitourinary: Negative.    Musculoskeletal: Negative.   Skin: Negative.   Neurological: Negative.   Hematological: Negative.   Psychiatric/Behavioral: Negative.     MEDICAL HISTORY:  Past Medical History:  Diagnosis Date  . Allergic rhinitis   . Allergy   . Asthma   . Kidney stone   .  Palpitation     SURGICAL HISTORY: Past Surgical History:  Procedure Laterality Date  . DERMOID CYST  EXCISION      SOCIAL HISTORY: Social History   Social History  . Marital status: Single    Spouse name: N/A  . Number of children: N/A  . Years of education: N/A   Occupational History  . Not on file.   Social History Main Topics  . Smoking status: Never Smoker  . Smokeless tobacco: Never Used  . Alcohol use No  . Drug use: No  . Sexual activity: Yes   Other Topics Concern  . Not on file   Social History Narrative  . No narrative on file    FAMILY HISTORY: Family History  Problem Relation Age of Onset  . Asthma Maternal Aunt   . Hypertension Other        Parent  . Stroke Other        Grandparent  . Mental illness Other        Grandparent  . Arthritis Other        Grandparent  . Hypertension Father     ALLERGIES:  is allergic to penicillin g.  MEDICATIONS:  Current Outpatient Prescriptions  Medication Sig Dispense Refill  . fluticasone (FLONASE) 50 MCG/ACT nasal spray Place 2 sprays into both nostrils daily. 16 g 6  . ibuprofen (ADVIL,MOTRIN) 200 MG tablet Take 200 mg by mouth every 6 (six) hours as needed.    . Multiple Vitamin (MULTIVITAMIN) capsule Take 1 capsule by mouth daily.     No current facility-administered medications for this visit.       Marland Kitchen  PHYSICAL EXAMINATION: ECOG PERFORMANCE STATUS: 0 - Asymptomatic Vitals:   11/22/16 1402  BP: 122/76  Pulse: 83  Temp: 98.8 F (37.1 C)   Filed Weights   11/22/16 1402  Weight: 154 lb 4 oz (70 kg)    GENERAL: Well-nourished well-developed; Alert, no distress and comfortable.  EYES: no pallor or icterus OROPHARYNX: no thrush or ulceration; good dentition  NECK: supple, no masses felt LYMPH:  no palpable lymphadenopathy in the cervical, axillary or inguinal regions LUNGS: clear to auscultation and  No wheeze or crackles HEART/CVS: regular rate & rhythm and no murmurs; No lower extremity  edema ABDOMEN: abdomen soft, non-tender and normal bowel sounds Musculoskeletal:no cyanosis of digits and no clubbing  PSYCH: alert & oriented x 3  NEURO: no focal motor/sensory deficits SKIN:  no rashes or significant lesions  LABORATORY DATA:  I have reviewed the data as listed Lab Results  Component Value Date   WBC 6.9 10/30/2016   HGB 14.7 10/30/2016   HCT 42.7 10/30/2016   MCV 89.4 10/30/2016   PLT 219 10/30/2016    Recent Labs  09/26/16 1115 10/30/16 1103  NA  --  136  K  --  4.2  CL  --  100*  CO2  --  28  GLUCOSE  --  90  BUN  --  12  CREATININE  --  0.90  CALCIUM  --  9.9  GFRNONAA  --  >60  GFRAA  --  >60  PROT 7.9 8.2*  ALBUMIN 4.7 4.9  AST 51* 60*  ALT 47 50  ALKPHOS 63 72  BILITOT 0.8 0.9  BILIDIR 0.1  --    Iron/TIBC/Ferritin/ %Sat    Component Value Date/Time   IRON 32 (L) 11/22/2016 1318   TIBC 285 11/22/2016 1318   FERRITIN 198 11/22/2016 1318   IRONPCTSAT 11 (L) 11/22/2016 1318   IRONPCTSAT 70 (H) 09/26/2016 1115   Hepatitis Panel all negative.   DNA Mutation Analysis              RESULT: COMPOUND HETEROZYGOUS FOR THE C282Y AND  H63D MUTATIONS           RADIOGRAPHIC STUDIES: I have personally reviewed the radiological images as listed and agreed with the findings in the report. No results found. No recent images. US abdomen pending.   ASSESSMENT & PLAN:  1. Hereditary hemochromatosis ([place])   2. 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  Ferritin has decreased as well TSAT Plan Phlebotomy 2 units Q2 weeks x 2 times with monitoring iron store. He will have 2 units removed today. .  Discussed about avoiding alcohol, and uncooked seafood. US abdomen was ordered per GI.  Discussed about screening first degree relative for hereditary hemochromatosis.  All questions were answered. The patient knows  to call the clinic with any problems questions or concerns.  Return of visit: 12/27/2016 with labs (cbc, iron TIBC ferritin and possible phlebotomy Thank you for this kind referral and the opportunity to participate in the care of this patient. A copy of today's note is routed to referring provider Dr.Sonnenberg.    Earlie Server, MD, PhD Hematology Oncology [facility] Pager- [phone] 11/22/2016

## 2016-11-27 ENCOUNTER — Other Ambulatory Visit
Admission: RE | Admit: 2016-11-27 | Discharge: 2016-11-27 | Disposition: A | Discharge status: Home / Self Care | Payer: BLUE CROSS/BLUE SHIELD | Source: Ambulatory Visit | Attending: Gastroenterology | Admitting: Gastroenterology

## 2016-11-27 LAB — TSH: TSH: 1.271 u[IU]/mL (ref 0.350–4.500)

## 2016-11-28 LAB — HIV ANTIBODY (ROUTINE TESTING W REFLEX): HIV SCREEN 4TH GENERATION: NONREACTIVE

## 2016-11-28 LAB — ANTI-SMOOTH MUSCLE ANTIBODY, IGG: F-ACTIN AB IGG: 7 U (ref 0–19)

## 2016-11-28 LAB — MITOCHONDRIAL ANTIBODIES: MITOCHONDRIAL M2 AB, IGG: 4.3 U (ref 0.0–20.0)

## 2016-11-28 LAB — ANA W/REFLEX: Anti Nuclear Antibody(ANA): NEGATIVE

## 2016-11-28 LAB — TISSUE TRANSGLUTAMINASE, IGA

## 2016-11-28 LAB — GLIADIN ANTIBODIES, SERUM
ANTIGLIADIN ABS, IGA: 6 U (ref 0–19)
Gliadin IgG: 3 units (ref 0–19)

## 2016-11-28 LAB — ANTI-MICROSOMAL ANTIBODY LIVER / KIDNEY: LKM1 Ab: 2.4 Units (ref 0.0–20.0)

## 2016-11-28 LAB — CERULOPLASMIN: CERULOPLASMIN: 18 mg/dL (ref 16.0–31.0)

## 2016-11-28 LAB — HEPATITIS A ANTIBODY, TOTAL: Hep A Total Ab: NEGATIVE

## 2016-11-29 ENCOUNTER — Telehealth: Payer: Self-pay | Admitting: Oncology

## 2016-11-29 LAB — ALPHA-1 ANTITRYPSIN PHENOTYPE: A-1 Antitrypsin, Ser: 110 mg/dL (ref 90–200)

## 2016-11-29 LAB — IMMUNOGLOBULINS A/E/G/M, SERUM
IgA: 156 mg/dL (ref 90–386)
IgE (Immunoglobulin E), Serum: 65 IU/mL (ref 0–100)
IgG (Immunoglobin G), Serum: 1272 mg/dL (ref 700–1600)
IgM (Immunoglobulin M), Srm: 139 mg/dL (ref 20–172)

## 2016-11-29 LAB — RETICULIN ANTIBODIES, IGA W TITER: RETICULIN AB, IGA: NEGATIVE {titer}

## 2016-11-29 NOTE — Telephone Encounter (Signed)
Appt rschd to earlier time,per provider on call. Schd and conf with pt. Updated appointment schedule also mailed to patient. MF

## 2016-12-01 ENCOUNTER — Encounter: Payer: Self-pay | Admitting: Family Medicine

## 2016-12-01 ENCOUNTER — Ambulatory Visit (INDEPENDENT_AMBULATORY_CARE_PROVIDER_SITE_OTHER): Payer: BLUE CROSS/BLUE SHIELD | Admitting: Family Medicine

## 2016-12-01 VITALS — BP 116/80 | HR 83 | Temp 97.5°F | Wt 157.4 lb

## 2016-12-01 DIAGNOSIS — J309 Allergic rhinitis, unspecified: Secondary | ICD-10-CM | POA: Diagnosis not present

## 2016-12-01 DIAGNOSIS — R7989 Other specified abnormal findings of blood chemistry: Secondary | ICD-10-CM

## 2016-12-01 DIAGNOSIS — R945 Abnormal results of liver function studies: Secondary | ICD-10-CM | POA: Diagnosis not present

## 2016-12-01 DIAGNOSIS — Z23 Encounter for immunization: Secondary | ICD-10-CM

## 2016-12-01 NOTE — Assessment & Plan Note (Signed)
Improved. Headaches may be tension in nature. Continue as needed ibuprofen.

## 2016-12-01 NOTE — Progress Notes (Signed)
  Cole Rumps, MD Phone: [phone]  Cole Lane is a 41 y.o. male who presents today for follow-up.  Patient has been following with hematology regarding hereditary hemochromatosis. He's been undergoing phlebotomy. He's had 2 sessions so far. He's felt well afterwards. He notes his numbers have been improving.  He saw GI for elevated LFTs. They recommended hepatitis A and hepatitis B vaccine given his hepatitis A antibody and hepatitis B surface antibody were negative. They also recommended tdap, flu shot, and pneumonia vaccine. Patient's hesitant to do the pneumonia vaccine.  Patient notes his allergy symptoms have improved. He's been using Flonase. He is less congested. Still getting headache once or twice a month that responds to 20 mg of ibuprofen.  He does report he got the flu shot 10 days ago and had quite a bruise from. The bruise is improving.  PMH: nonsmoker.   ROS see history of present illness  Objective  Physical Exam Vitals:   12/01/16 1009  BP: 116/80  Pulse: 83  Temp: (!) 97.5 F (36.4 C)  SpO2: 98%    BP Readings from Last 3 Encounters:  12/01/16 116/80  11/22/16 129/75  11/22/16 122/76   Wt Readings from Last 3 Encounters:  12/01/16 157 lb 6.4 oz (71.4 kg)  11/22/16 154 lb 4 oz (70 kg)  11/21/16 156 lb 12.8 oz (71.1 kg)    Physical Exam  Constitutional: No distress.  HENT:  Head: Normocephalic and atraumatic.  Mouth/Throat: Oropharynx is clear and moist. No oropharyngeal exudate.  Eyes: Pupils are equal, round, and reactive to light. Conjunctivae are normal.  Cardiovascular: Normal rate, regular rhythm and normal heart sounds.   Pulmonary/Chest: Effort normal and breath sounds normal.  Abdominal: Soft. Bowel sounds are normal. He exhibits no distension. There is no tenderness.  Musculoskeletal: He exhibits no edema.  Neurological: He is alert.  CN 2-12 intact, 5/5 strength in bilateral biceps, triceps, grip, quads, hamstrings, plantar  and dorsiflexion, sensation to light touch intact in bilateral UE and LE  Skin: He is not diaphoretic.   bruise left deltoid appears to be resolving   Assessment/Plan: Please see individual problem list.  Allergic rhinitis Improved. Headaches may be tension in nature. Continue as needed ibuprofen.  Elevated LFTs Likely related to hereditary hemochromatosis. He has followed with GI. Most recent note reviewed. We'll proceed with hepatitis A/B vaccination. Tetanus vaccination as well. We'll send a message at the patient's request to confirm pneumonia vaccine need.  Hereditary hemochromatosis ([place]) Continue to follow with hematology.  He will monitor the bruise in his left deltoid.  Cole Rumps, MD [place]

## 2016-12-01 NOTE — Assessment & Plan Note (Signed)
Continue to follow with hematology

## 2016-12-01 NOTE — Patient Instructions (Signed)
Nice to see you. Please continue to follow with hematology. Please monitor the bruise from her flu shot. If it does not improve please let us know.

## 2016-12-01 NOTE — Assessment & Plan Note (Signed)
Likely related to hereditary hemochromatosis. He has followed with GI. Most recent note reviewed. We'll proceed with hepatitis A/B vaccination. Tetanus vaccination as well. We'll send a message at the patient's request to confirm pneumonia vaccine need.

## 2016-12-05 ENCOUNTER — Telehealth: Payer: Self-pay | Admitting: Family Medicine

## 2016-12-05 NOTE — Telephone Encounter (Signed)
Please let the patient know I heard back from Dr. Vicente Males regarding the pneumonia vaccine. He agreed that it would be a good idea to move forward with getting the pneumonia vaccine given his liver issues. If the patient is willing we can arrange for him to have this in the office. It would be the Pneumovax vaccine.

## 2016-12-05 NOTE — Telephone Encounter (Signed)
Patient notified and will have this done in nov

## 2016-12-05 NOTE — Telephone Encounter (Signed)
Left message to return call 

## 2016-12-05 NOTE — Telephone Encounter (Signed)
-----   Message from [name], MD sent at 12/05/2016  8:07 AM EDT ----- Good morning   Good to hear from you and I completely agree with you that since he has a chronic liver disease , the benefits of a pneumonia vaccine outweigh the risks and would recommend to proceed.  Regards  Kiran  ----- Message ----- From: [name], MD Sent: 12/01/2016  10:36 AM To: [name], MD  Hi Dr Vicente Males,   I saw Mr Colter today in follow up and reviewed your recommendations for vaccinations with him. We provided him with Tdap and hep A/B vaccines. He was hesitant to do the pneumonia vaccine and wanted me to check with you regarding this vaccine. I know it is recommended for patients with chronic liver issues and advised him of this, though I think it would be helpful me to be able to tell him that you agree and confirmed his need for this vaccine. Thanks for your help.  Randall Hiss

## 2016-12-18 ENCOUNTER — Telehealth: Payer: Self-pay

## 2016-12-18 NOTE — Telephone Encounter (Signed)
-----   Message from [name], MD sent at 12/17/2016  7:11 PM EDT ----- Needs hep a vaccine- autoimmune work up was negative

## 2016-12-18 NOTE — Telephone Encounter (Signed)
LVM for patient callback for results per Dr. Anna.  

## 2016-12-27 ENCOUNTER — Inpatient Hospital Stay (HOSPITAL_BASED_OUTPATIENT_CLINIC_OR_DEPARTMENT_OTHER): Payer: BLUE CROSS/BLUE SHIELD | Admitting: Oncology

## 2016-12-27 ENCOUNTER — Inpatient Hospital Stay: Payer: BLUE CROSS/BLUE SHIELD | Attending: Oncology

## 2016-12-27 ENCOUNTER — Inpatient Hospital Stay: Payer: BLUE CROSS/BLUE SHIELD

## 2016-12-27 ENCOUNTER — Other Ambulatory Visit: Payer: BLUE CROSS/BLUE SHIELD

## 2016-12-27 ENCOUNTER — Encounter: Payer: Self-pay | Admitting: Oncology

## 2016-12-27 ENCOUNTER — Ambulatory Visit: Payer: BLUE CROSS/BLUE SHIELD | Admitting: Oncology

## 2016-12-27 DIAGNOSIS — Z880 Allergy status to penicillin: Secondary | ICD-10-CM | POA: Insufficient documentation

## 2016-12-27 DIAGNOSIS — Z79899 Other long term (current) drug therapy: Secondary | ICD-10-CM | POA: Diagnosis not present

## 2016-12-27 DIAGNOSIS — Z87442 Personal history of urinary calculi: Secondary | ICD-10-CM

## 2016-12-27 LAB — CBC WITH DIFFERENTIAL/PLATELET
Basophils Absolute: 0.1 10*3/uL (ref 0–0.1)
Basophils Relative: 1 %
EOS ABS: 0.1 10*3/uL (ref 0–0.7)
Eosinophils Relative: 2 %
HCT: 39.4 % — ABNORMAL LOW (ref 40.0–52.0)
HEMOGLOBIN: 13.4 g/dL (ref 13.0–18.0)
LYMPHS ABS: 1.5 10*3/uL (ref 1.0–3.6)
LYMPHS PCT: 30 %
MCH: 31 pg (ref 26.0–34.0)
MCHC: 34.1 g/dL (ref 32.0–36.0)
MCV: 91.1 fL (ref 80.0–100.0)
Monocytes Absolute: 0.5 10*3/uL (ref 0.2–1.0)
Monocytes Relative: 10 %
NEUTROS ABS: 2.9 10*3/uL (ref 1.4–6.5)
NEUTROS PCT: 57 %
Platelets: 198 10*3/uL (ref 150–440)
RBC: 4.32 MIL/uL — ABNORMAL LOW (ref 4.40–5.90)
RDW: 12.2 % (ref 11.5–14.5)
WBC: 5.1 10*3/uL (ref 3.8–10.6)

## 2016-12-27 LAB — IRON AND TIBC
Iron: 69 ug/dL (ref 45–182)
Saturation Ratios: 25 % (ref 17.9–39.5)
TIBC: 282 ug/dL (ref 250–450)
UIBC: 213 ug/dL

## 2016-12-27 LAB — FERRITIN: Ferritin: 209 ng/mL (ref 24–336)

## 2016-12-27 NOTE — Progress Notes (Signed)
Hematology/Oncology Follow Up Visit [facility] [phone] [phone]   Patient Care Team: [name], MD as PCP - General (Family Medicine)  Referring Physician Dr.Sonnernberg.Eric  REASON FOR VISIT Follow up for treatment of hemochromatosis and iron overload  PERTINENT HEMATOLOGY HISTORY Cole Lane 41 y.o.  male with past medical history listed as per low who was referred by Dr. 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 Patient denies any family history of hemochromatosis. He is eating history his health state. He feels well at baseline except sometimes if he stands up pretty fast for dizziness. He has been following up with primary care physician and has been noticed that his liver function tests has been mildly elevated. Additional workup showed high ferritin 353, also high transferrin saturation of 70%. 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 He is married and has no children yet.   # Discussed about avoiding alcohol, and uncooked seafood.  #Discussed about screening first degree relative for hereditary hemochromatosis.   INTERVAL HISTORY Cole Lane is a 41 y.o. male who has above history reviewed by me today presents for follow up visit for management of hemochromatosis. He has got phlebotomy for 4 units during the interval, tolerate well. He follows up with GI Dr.Anna and has ongoing work up. US abdomen was suggested and has not been done yet. He has no complaints today.   ROS:  Review of Systems  Constitutional: Negative.  Negative for chills and fatigue.  HENT:  Negative.  Negative for lump/mass.   Eyes: Negative.  Negative for eye problems.  Respiratory: Negative.  Negative for chest tightness.   Cardiovascular: Negative.  Negative for chest pain.  Gastrointestinal: Negative.  Negative for abdominal distention.    Endocrine: Negative.  Negative for hot flashes.  Genitourinary: Negative.  Negative for dyspareunia and dysuria.   Musculoskeletal: Negative.  Negative for arthralgias.  Skin: Negative.  Negative for itching.  Neurological: Negative for headaches.  Hematological: Negative.  Negative for adenopathy.  Psychiatric/Behavioral: Negative.  The patient is not nervous/anxious.     MEDICAL HISTORY:  Past Medical History:  Diagnosis Date  . Allergic rhinitis   . Allergy   . Asthma   . Kidney stone   . Palpitation     SURGICAL HISTORY: Past Surgical History:  Procedure Laterality Date  . DERMOID CYST  EXCISION      SOCIAL HISTORY: Social History   Socioeconomic History  . Marital status: Single    Spouse name: Not on file  . Number of children: Not on file  . Years of education: Not on file  . Highest education level: Not on file  Social Needs  . Financial resource strain: Not on file  . Food insecurity - worry: Not on file  . Food insecurity - inability: Not on file  . Transportation needs - medical: Not on file  . Transportation needs - non-medical: Not on file  Occupational History  . Not on file  Tobacco Use  . Smoking status: Never Smoker  . Smokeless tobacco: Never Used  Substance and Sexual Activity  . Alcohol use: No  . Drug use: No  . Sexual activity: Yes  Other Topics Concern  . Not on file  Social History Narrative  . Not on file    FAMILY HISTORY: Family History  Problem Relation Age of Onset  . Asthma Maternal Aunt   . Hypertension Other  Parent  . Stroke Other        Grandparent  . Mental illness Other        Grandparent  . Arthritis Other        Grandparent  . Hypertension Father     ALLERGIES:  is allergic to penicillin g.  MEDICATIONS:  Current Outpatient Medications  Medication Sig Dispense Refill  . fluticasone (FLONASE) 50 MCG/ACT nasal spray Place 2 sprays into both nostrils daily. 16 g 6  . ibuprofen (ADVIL,MOTRIN) 200 MG  tablet Take 200 mg by mouth every 6 (six) hours as needed.     No current facility-administered medications for this visit.       Marland Kitchen  PHYSICAL EXAMINATION: ECOG PERFORMANCE STATUS: 0 - Asymptomatic There were no vitals filed for this visit. There were no vitals filed for this visit. GENERAL: No distress, well nourished.  SKIN:  No rashes or significant lesions  HEAD: Normocephalic, No masses, lesions, tenderness or abnormalities  EYES: Conjunctiva are pink, non icteric ENT: External ears normal ,lips , buccal mucosa, and tongue normal and mucous membranes are moist  LYMPH: No palpable cervical and axillary lymphadenopathy  LUNGS: Clear to auscultation, no crackles or wheezes HEART: Regular rate & rhythm, no murmurs, no gallops, S1 normal and S2 normal  ABDOMEN: Abdomen soft, non-tender, normal bowel sounds, I did not appreciate any  masses or organomegaly  MUSCULOSKELETAL: No CVA tenderness and no tenderness on percussion of the back or rib cage.  EXTREMITIES: No edema, no skin discoloration or tenderness NEURO: Alert & oriented, no focal motor/sensory deficits.  LABORATORY DATA:  I have reviewed the data as listed Lab Results  Component Value Date   WBC 5.4 11/22/2016   HGB 13.5 11/22/2016   HCT 38.7 (L) 11/22/2016   MCV 90.5 11/22/2016   PLT 182 11/22/2016   Recent Labs    09/26/16 1115 10/30/16 1103  NA  --  136  K  --  4.2  CL  --  100*  CO2  --  28  GLUCOSE  --  90  BUN  --  12  CREATININE  --  0.90  CALCIUM  --  9.9  GFRNONAA  --  >60  GFRAA  --  >60  PROT 7.9 8.2*  ALBUMIN 4.7 4.9  AST 51* 60*  ALT 47 50  ALKPHOS 63 72  BILITOT 0.8 0.9  BILIDIR 0.1  --    Iron/TIBC/Ferritin/ %Sat    Component Value Date/Time   IRON 32 (L) 11/22/2016 1318   TIBC 285 11/22/2016 1318   FERRITIN 198 11/22/2016 1318   IRONPCTSAT 11 (L) 11/22/2016 1318   IRONPCTSAT 70 (H) 09/26/2016 1115   Hepatitis Panel all negative.   DNA Mutation Analysis               RESULT: COMPOUND HETEROZYGOUS FOR THE C282Y AND H63D MUTATIONS           RADIOGRAPHIC STUDIES: I have personally reviewed the radiological images as listed and agreed with the findings in the report. No results found. US abdomen pending.   ASSESSMENT & PLAN:  1. 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 Today's ferritin is pending. Hemoglobin is normal..  Will hold Phlebotomy today as he is going on a vacation to [place]. # Discussed about avoiding alcohol, and uncooked seafood and patient voices understanding. # US abdomen pending  he prefers to get it done next year.  Repeat iron TIBC ferritin in 4 weeks and plan phlebotomy All questions were answered. The patient knows to call the clinic with any problems questions or concerns.  Return of visit: CBC,  iron TIBC ferritin in 4 weeks and follow up a few days later.   Earlie Server, MD, PhD Hematology Oncology [facility] Pager- [phone] 12/27/2016

## 2016-12-27 NOTE — Progress Notes (Signed)
Patient here for follow up with labs today. He states that he is feeling well and denies having any pain. 

## 2017-01-17 ENCOUNTER — Ambulatory Visit (INDEPENDENT_AMBULATORY_CARE_PROVIDER_SITE_OTHER): Payer: BLUE CROSS/BLUE SHIELD | Admitting: *Deleted

## 2017-01-17 DIAGNOSIS — Z23 Encounter for immunization: Secondary | ICD-10-CM

## 2017-01-22 ENCOUNTER — Inpatient Hospital Stay: Payer: BLUE CROSS/BLUE SHIELD

## 2017-01-22 DIAGNOSIS — Z79899 Other long term (current) drug therapy: Secondary | ICD-10-CM | POA: Diagnosis not present

## 2017-01-22 LAB — CBC WITH DIFFERENTIAL/PLATELET
BASOS ABS: 0.1 10*3/uL (ref 0–0.1)
Basophils Relative: 1 %
EOS PCT: 2 %
Eosinophils Absolute: 0.1 10*3/uL (ref 0–0.7)
HEMATOCRIT: 41.9 % (ref 40.0–52.0)
Hemoglobin: 14.2 g/dL (ref 13.0–18.0)
LYMPHS ABS: 1.7 10*3/uL (ref 1.0–3.6)
LYMPHS PCT: 32 %
MCH: 30.5 pg (ref 26.0–34.0)
MCHC: 33.9 g/dL (ref 32.0–36.0)
MCV: 89.8 fL (ref 80.0–100.0)
MONO ABS: 0.5 10*3/uL (ref 0.2–1.0)
MONOS PCT: 8 %
Neutro Abs: 3.1 10*3/uL (ref 1.4–6.5)
Neutrophils Relative %: 57 %
PLATELETS: 211 10*3/uL (ref 150–440)
RBC: 4.67 MIL/uL (ref 4.40–5.90)
RDW: 11.6 % (ref 11.5–14.5)
WBC: 5.4 10*3/uL (ref 3.8–10.6)

## 2017-01-22 LAB — FERRITIN: FERRITIN: 196 ng/mL (ref 24–336)

## 2017-01-22 LAB — IRON AND TIBC
IRON: 103 ug/dL (ref 45–182)
Saturation Ratios: 36 % (ref 17.9–39.5)
TIBC: 290 ug/dL (ref 250–450)
UIBC: 187 ug/dL

## 2017-01-23 NOTE — Progress Notes (Signed)
Hematology/Oncology Follow Up Visit [facility] Telephone:([phone] Fax:(336) [phone]   Patient Care Team: [name], MD as PCP - General (Family Medicine)  Referring Physician Dr.Sonnernberg.Eric  REASON FOR VISIT Follow up for treatment of hemochromatosis and iron overload  PERTINENT HEMATOLOGY HISTORY Cole Lane 41 y.o.  male with past medical history listed as per low who was referred by Dr. 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 Patient denies any family history of hemochromatosis. He is eating history his health state. He feels well at baseline except sometimes if he stands up pretty fast for dizziness. He has been following up with primary care physician and has been noticed that his liver function tests has been mildly elevated. Additional workup showed high ferritin 353, also high transferrin saturation of 70%. 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 He is married and has no children yet.  He follows up with GI Dr.Anna and has ongoing work up. US abdomen was suggested and has not been done yet.  # Discussed about avoiding alcohol, and uncooked seafood.  #Discussed about screening first degree relative for hereditary hemochromatosis.   INTERVAL HISTORY Cole Lane is a 41 y.o. male who has above history reviewed by me today presents for follow up visit for management of hemochromatosis. He has no complaints today. He just came back from [place] and still has jet lag.   ROS:  Review of Systems  Constitutional: Negative for appetite change, diaphoresis, fatigue and fever.  HENT:   Negative for hearing loss and lump/mass.   Eyes: Negative.  Negative for eye problems.  Respiratory: Negative for chest tightness and cough.   Cardiovascular: Negative for chest pain and leg swelling.  Gastrointestinal: Negative for abdominal distention and  abdominal pain.  Endocrine: Negative for hot flashes.  Genitourinary: Negative for difficulty urinating, dyspareunia and dysuria.   Musculoskeletal: Negative for arthralgias and back pain.  Skin: Negative for itching.  Neurological: Negative for dizziness and headaches.  Hematological: Negative for adenopathy. Does not bruise/bleed easily.  Psychiatric/Behavioral: The patient is not nervous/anxious.     MEDICAL HISTORY:  Past Medical History:  Diagnosis Date  . Allergic rhinitis   . Allergy   . Asthma   . Kidney stone   . Palpitation     SURGICAL HISTORY: Past Surgical History:  Procedure Laterality Date  . DERMOID CYST  EXCISION      SOCIAL HISTORY: Social History   Socioeconomic History  . Marital status: Single    Spouse name: Not on file  . Number of children: Not on file  . Years of education: Not on file  . Highest education level: Not on file  Social Needs  . Financial resource strain: Not on file  . Food insecurity - worry: Not on file  . Food insecurity - inability: Not on file  . Transportation needs - medical: Not on file  . Transportation needs - non-medical: Not on file  Occupational History  . Not on file  Tobacco Use  . Smoking status: Never Smoker  . Smokeless tobacco: Never Used  Substance and Sexual Activity  . Alcohol use: No  . Drug use: No  . Sexual activity: Yes  Other Topics Concern  . Not on file  Social History Narrative  . Not on file    FAMILY HISTORY: Family History  Problem Relation Age of Onset  . Asthma Maternal Aunt   . Hypertension Other  Parent  . Stroke Other        Grandparent  . Mental illness Other        Grandparent  . Arthritis Other        Grandparent  . Hypertension Father     ALLERGIES:  is allergic to penicillin g.  MEDICATIONS:  Current Outpatient Medications  Medication Sig Dispense Refill  . fluticasone (FLONASE) 50 MCG/ACT nasal spray Place 2 sprays into both nostrils daily. 16 g 6  .  ibuprofen (ADVIL,MOTRIN) 200 MG tablet Take 200 mg by mouth every 6 (six) hours as needed.     No current facility-administered medications for this visit.       Marland Kitchen  PHYSICAL EXAMINATION: ECOG PERFORMANCE STATUS: 0 - Asymptomatic Vitals:   01/24/17 1441  BP: 106/71  Pulse: 68  Temp: 97.8 F (36.6 C)   Filed Weights   01/24/17 1441  Weight: 158 lb 4 oz (71.8 kg)   Physical Exam  Constitutional: He is oriented to person, place, and time. No distress.  HENT:  Head: Normocephalic and atraumatic.  Eyes: EOM are normal. Pupils are equal, round, and reactive to light.  Neck: Neck supple.  Cardiovascular: Normal rate and regular rhythm.  No murmur heard. Pulmonary/Chest: Effort normal and breath sounds normal. No respiratory distress.  Abdominal: Soft. Bowel sounds are normal. He exhibits no distension.  Musculoskeletal: Normal range of motion.  Lymphadenopathy:    He has no cervical adenopathy.  Neurological: He is alert and oriented to person, place, and time. He exhibits normal muscle tone.  Skin: Skin is warm and dry.  Psychiatric: Affect normal.   LABORATORY DATA:  I have reviewed the data as listed Lab Results  Component Value Date   WBC 5.4 01/22/2017   HGB 14.2 01/22/2017   HCT 41.9 01/22/2017   MCV 89.8 01/22/2017   PLT 211 01/22/2017   Recent Labs    09/26/16 1115 10/30/16 1103  NA  --  136  K  --  4.2  CL  --  100*  CO2  --  28  GLUCOSE  --  90  BUN  --  12  CREATININE  --  0.90  CALCIUM  --  9.9  GFRNONAA  --  >60  GFRAA  --  >60  PROT 7.9 8.2*  ALBUMIN 4.7 4.9  AST 51* 60*  ALT 47 50  ALKPHOS 63 72  BILITOT 0.8 0.9  BILIDIR 0.1  --    Iron/TIBC/Ferritin/ %Sat    Component Value Date/Time   IRON 103 01/22/2017 0915   TIBC 290 01/22/2017 0915   FERRITIN 196 01/22/2017 0915   IRONPCTSAT 36 01/22/2017 0915   IRONPCTSAT 70 (H) 09/26/2016 1115   Hepatitis Panel all negative.   DNA Mutation Analysis              RESULT:  COMPOUND HETEROZYGOUS FOR THE C282Y AND H63D MUTATIONS           ASSESSMENT & PLAN:  1. Hereditary hemochromatosis ([place])   2. 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  # US abdomen pending he prefers to get it done next year.  Plan phlebotomy 500 ml weekly x 3, and repeat cbc, ferritin.   Repeat iron TIBC ferritin in 4 weeks and plan phlebotomy All questions were answered. The patient knows to call the clinic with any problems questions or  concerns.  Return of visit: CBC,  iron TIBC ferritin in 4 weeks and follow up a few days later.   Earlie Server, MD, PhD Hematology Oncology [facility] at [facility] Pager- [phone] 01/23/2017

## 2017-01-24 ENCOUNTER — Inpatient Hospital Stay: Payer: BLUE CROSS/BLUE SHIELD | Attending: Oncology | Admitting: Oncology

## 2017-01-24 ENCOUNTER — Encounter: Payer: Self-pay | Admitting: Oncology

## 2017-01-24 ENCOUNTER — Other Ambulatory Visit: Payer: BLUE CROSS/BLUE SHIELD

## 2017-01-24 ENCOUNTER — Other Ambulatory Visit: Payer: Self-pay

## 2017-01-24 ENCOUNTER — Inpatient Hospital Stay: Payer: BLUE CROSS/BLUE SHIELD

## 2017-01-24 DIAGNOSIS — Z79899 Other long term (current) drug therapy: Secondary | ICD-10-CM | POA: Diagnosis not present

## 2017-01-24 NOTE — Progress Notes (Signed)
Patient here today for follow up.  Patient states no new concerns today  

## 2017-01-25 ENCOUNTER — Telehealth: Payer: Self-pay | Admitting: Gastroenterology

## 2017-01-25 NOTE — Telephone Encounter (Signed)
Patient needs to schedule an ultrasound and needs to know how to do it. Please call

## 2017-01-25 NOTE — Telephone Encounter (Signed)
LVM with instructions per Patient's message:   USG abd has been ordered by Dr. Vicente Males on 10/2. Patient can call [phone] to schedule the time.

## 2017-02-02 ENCOUNTER — Inpatient Hospital Stay: Payer: BLUE CROSS/BLUE SHIELD

## 2017-02-02 DIAGNOSIS — Z79899 Other long term (current) drug therapy: Secondary | ICD-10-CM | POA: Diagnosis not present

## 2017-02-02 LAB — CBC
HCT: 37.2 % — ABNORMAL LOW (ref 40.0–52.0)
HEMOGLOBIN: 12.2 g/dL — AB (ref 13.0–18.0)
MCH: 29.7 pg (ref 26.0–34.0)
MCHC: 32.9 g/dL (ref 32.0–36.0)
MCV: 90.5 fL (ref 80.0–100.0)
PLATELETS: 237 10*3/uL (ref 150–440)
RBC: 4.11 MIL/uL — ABNORMAL LOW (ref 4.40–5.90)
RDW: 11.8 % (ref 11.5–14.5)
WBC: 11.4 10*3/uL — ABNORMAL HIGH (ref 3.8–10.6)

## 2017-02-02 LAB — FERRITIN: FERRITIN: 259 ng/mL (ref 24–336)

## 2017-02-09 ENCOUNTER — Inpatient Hospital Stay: Payer: BLUE CROSS/BLUE SHIELD

## 2017-02-09 DIAGNOSIS — Z79899 Other long term (current) drug therapy: Secondary | ICD-10-CM | POA: Diagnosis not present

## 2017-02-09 LAB — CBC
HCT: 38.1 % — ABNORMAL LOW (ref 40.0–52.0)
Hemoglobin: 12.7 g/dL — ABNORMAL LOW (ref 13.0–18.0)
MCH: 30 pg (ref 26.0–34.0)
MCHC: 33.4 g/dL (ref 32.0–36.0)
MCV: 89.7 fL (ref 80.0–100.0)
PLATELETS: 319 10*3/uL (ref 150–440)
RBC: 4.24 MIL/uL — AB (ref 4.40–5.90)
RDW: 12 % (ref 11.5–14.5)
WBC: 8.4 10*3/uL (ref 3.8–10.6)

## 2017-02-15 ENCOUNTER — Ambulatory Visit
Admission: RE | Admit: 2017-02-15 | Discharge: 2017-02-15 | Disposition: A | Discharge status: Home / Self Care | Payer: BLUE CROSS/BLUE SHIELD | Source: Ambulatory Visit | Attending: Gastroenterology | Admitting: Gastroenterology

## 2017-02-15 DIAGNOSIS — N281 Cyst of kidney, acquired: Secondary | ICD-10-CM | POA: Insufficient documentation

## 2017-02-22 ENCOUNTER — Telehealth: Payer: Self-pay

## 2017-02-22 NOTE — Telephone Encounter (Signed)
Advised patient of results per Dr. Vicente Males.    - Chololithiasis if asymptomatic we do nothing   Renal cyst - can follow up with PCP- no concerning features reported on USG   He has no features of portal hypertension- will address his liver issues at follow up per plan in last office note

## 2017-02-27 ENCOUNTER — Inpatient Hospital Stay: Payer: BLUE CROSS/BLUE SHIELD | Attending: Oncology

## 2017-02-27 LAB — CBC
HCT: 37.3 % — ABNORMAL LOW (ref 40.0–52.0)
Hemoglobin: 12.5 g/dL — ABNORMAL LOW (ref 13.0–18.0)
MCH: 29.8 pg (ref 26.0–34.0)
MCHC: 33.3 g/dL (ref 32.0–36.0)
MCV: 89.3 fL (ref 80.0–100.0)
PLATELETS: 209 10*3/uL (ref 150–440)
RBC: 4.18 MIL/uL — AB (ref 4.40–5.90)
RDW: 12.8 % (ref 11.5–14.5)
WBC: 6 10*3/uL (ref 3.8–10.6)

## 2017-02-27 NOTE — Progress Notes (Signed)
Hematology/Oncology Follow Up Visit [facility] Telephone:([phone] Fax:(336) [phone]   Patient Care Team: [name], MD as PCP - General (Family Medicine)  Referring Physician Dr.Sonnernberg.Eric  REASON FOR VISIT Follow up for treatment of hemochromatosis and iron overload  PERTINENT HEMATOLOGY HISTORY Cole Lane 42 y.o.  male with past medical history listed as per low who was referred by Dr. 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 Patient denies any family history of hemochromatosis. He is eating history his health state. He feels well at baseline except sometimes if he stands up pretty fast for dizziness. He has been following up with primary care physician and has been noticed that his liver function tests has been mildly elevated. Additional workup showed high ferritin 353, also high transferrin saturation of 70%. 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 He is married and has no children yet.  He follows up with GI Dr.Anna and has ongoing work up. US abdomen was suggested and has not been done yet.  # Discussed about avoiding alcohol, and uncooked seafood.  #Discussed about screening first degree relative for hereditary hemochromatosis.   INTERVAL HISTORY Cole Lane is a 42 y.o. male who has above history reviewed by me today presents for follow up visit for management of hemochromatosis. Patient reports doing fine, he does not have any complaints today.  ROS:  Review of Systems  Constitutional: Negative for appetite change and chills.  HENT:   Negative for hearing loss, lump/mass and mouth sores.   Eyes: Negative.  Negative for eye problems and icterus.  Respiratory: Negative for chest tightness, cough and hemoptysis.   Cardiovascular: Negative for chest pain.  Gastrointestinal: Negative for abdominal distention.  Endocrine: Negative for  hot flashes.  Genitourinary: Negative for dysuria and frequency.   Musculoskeletal: Negative for arthralgias, flank pain and gait problem.  Skin: Negative for itching and rash.  Neurological: Negative for dizziness, gait problem and headaches.  Hematological: Negative for adenopathy. Does not bruise/bleed easily.  Psychiatric/Behavioral: Negative for confusion. The patient is not nervous/anxious.     MEDICAL HISTORY:  Past Medical History:  Diagnosis Date  . Allergic rhinitis   . Allergy   . Asthma   . Kidney stone   . Palpitation     SURGICAL HISTORY: Past Surgical History:  Procedure Laterality Date  . DERMOID CYST  EXCISION      SOCIAL HISTORY: Social History   Socioeconomic History  . Marital status: Single    Spouse name: Not on file  . Number of children: Not on file  . Years of education: Not on file  . Highest education level: Not on file  Social Needs  . Financial resource strain: Not on file  . Food insecurity - worry: Not on file  . Food insecurity - inability: Not on file  . Transportation needs - medical: Not on file  . Transportation needs - non-medical: Not on file  Occupational History  . Not on file  Tobacco Use  . Smoking status: Never Smoker  . Smokeless tobacco: Never Used  Substance and Sexual Activity  . Alcohol use: No  . Drug use: No  . Sexual activity: Yes  Other Topics Concern  . Not on file  Social History Narrative  . Not on file    FAMILY HISTORY: Family History  Problem Relation Age of Onset  . Asthma Maternal Aunt   . Hypertension Other  Parent  . Stroke Other        Grandparent  . Mental illness Other        Grandparent  . Arthritis Other        Grandparent  . Hypertension Father     ALLERGIES:  is allergic to penicillin g.  MEDICATIONS:  Current Outpatient Medications  Medication Sig Dispense Refill  . fluticasone (FLONASE) 50 MCG/ACT nasal spray Place 2 sprays into both nostrils daily. (Patient not  taking: Reported on 02/28/2017) 16 g 6  . ibuprofen (ADVIL,MOTRIN) 200 MG tablet Take 200 mg by mouth every 6 (six) hours as needed.     No current facility-administered medications for this visit.       Marland Kitchen  PHYSICAL EXAMINATION: ECOG PERFORMANCE STATUS: 0 - Asymptomatic Vitals:   02/28/17 1416  BP: 110/71  Pulse: 87  Temp: 97.9 F (36.6 C)   Filed Weights   02/28/17 1416  Weight: 159 lb 8 oz (72.3 kg)   Physical Exam  Constitutional: He is oriented to person, place, and time. No distress.  HENT:  Head: Normocephalic and atraumatic.  Eyes: EOM are normal. Pupils are equal, round, and reactive to light.  Neck: Neck supple.  Cardiovascular: Normal rate and regular rhythm.  No murmur heard. Pulmonary/Chest: Effort normal and breath sounds normal. No respiratory distress.  Abdominal: Soft. Bowel sounds are normal. He exhibits no distension.  Musculoskeletal: Normal range of motion.  Lymphadenopathy:    He has no cervical adenopathy.  Neurological: He is alert and oriented to person, place, and time. He exhibits normal muscle tone.  Skin: Skin is warm and dry.  Psychiatric: Affect normal.   LABORATORY DATA:  I have reviewed the data as listed Lab Results  Component Value Date   WBC 6.1 02/28/2017   HGB 12.6 (L) 02/28/2017   HCT 38.1 (L) 02/28/2017   MCV 89.6 02/28/2017   PLT 211 02/28/2017   Recent Labs    09/26/16 1115 10/30/16 1103  NA  --  136  K  --  4.2  CL  --  100*  CO2  --  28  GLUCOSE  --  90  BUN  --  12  CREATININE  --  0.90  CALCIUM  --  9.9  GFRNONAA  --  >60  GFRAA  --  >60  PROT 7.9 8.2*  ALBUMIN 4.7 4.9  AST 51* 60*  ALT 47 50  ALKPHOS 63 72  BILITOT 0.8 0.9  BILIDIR 0.1  --    Iron/TIBC/Ferritin/ %Sat    Component Value Date/Time   IRON 103 01/22/2017 0915   TIBC 290 01/22/2017 0915   FERRITIN 259 02/02/2017 1418   IRONPCTSAT 36 01/22/2017 0915   IRONPCTSAT 70 (H) 09/26/2016 1115   Hepatitis Panel all negative.   DNA Mutation  Analysis              RESULT: COMPOUND HETEROZYGOUS FOR THE C282Y AND H63D MUTATIONS           ASSESSMENT & PLAN:  1. 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 He has had phlebotomy 551ml x 2 afterwards. Patient reports that he was sick when the last ferritin was checked, which explained that the ferritin was increased. We'll recheck his iron level today. Continue plembotomy with goal of TSAT <45% and ferritin at least less than 100 or optimize less than 50.  # US abdomen  showed kidney cyst otherwise normal.  Repeat ferritin is on 107, close to our target. I will proceed with one more phlebotomy with removal of 500 ml.   All questions were answered. The patient knows to call the clinic with any problems questions or concerns.  Return of visit: CBC,  iron TIBC ferritin in 6 months, MD and possible Phlebotomy in 6 months.   Earlie Server, MD, PhD Hematology Oncology [facility] at [facility] Pager- [phone] 02/28/2017

## 2017-02-28 ENCOUNTER — Other Ambulatory Visit: Payer: BLUE CROSS/BLUE SHIELD

## 2017-02-28 ENCOUNTER — Inpatient Hospital Stay: Payer: BLUE CROSS/BLUE SHIELD

## 2017-02-28 ENCOUNTER — Encounter: Payer: Self-pay | Admitting: Oncology

## 2017-02-28 ENCOUNTER — Other Ambulatory Visit: Payer: Self-pay

## 2017-02-28 ENCOUNTER — Inpatient Hospital Stay (HOSPITAL_BASED_OUTPATIENT_CLINIC_OR_DEPARTMENT_OTHER): Payer: BLUE CROSS/BLUE SHIELD | Admitting: Oncology

## 2017-02-28 LAB — IRON AND TIBC
IRON: 79 ug/dL (ref 45–182)
Saturation Ratios: 25 % (ref 17.9–39.5)
TIBC: 319 ug/dL (ref 250–450)
UIBC: 241 ug/dL

## 2017-02-28 LAB — CBC WITH DIFFERENTIAL/PLATELET
Basophils Absolute: 0.1 10*3/uL (ref 0–0.1)
Basophils Relative: 1 %
EOS ABS: 0.1 10*3/uL (ref 0–0.7)
EOS PCT: 2 %
HCT: 38.1 % — ABNORMAL LOW (ref 40.0–52.0)
Hemoglobin: 12.6 g/dL — ABNORMAL LOW (ref 13.0–18.0)
LYMPHS ABS: 1.8 10*3/uL (ref 1.0–3.6)
LYMPHS PCT: 30 %
MCH: 29.7 pg (ref 26.0–34.0)
MCHC: 33.1 g/dL (ref 32.0–36.0)
MCV: 89.6 fL (ref 80.0–100.0)
MONO ABS: 0.6 10*3/uL (ref 0.2–1.0)
Monocytes Relative: 9 %
Neutro Abs: 3.5 10*3/uL (ref 1.4–6.5)
Neutrophils Relative %: 58 %
PLATELETS: 211 10*3/uL (ref 150–440)
RBC: 4.25 MIL/uL — ABNORMAL LOW (ref 4.40–5.90)
RDW: 12.8 % (ref 11.5–14.5)
WBC: 6.1 10*3/uL (ref 3.8–10.6)

## 2017-02-28 LAB — FERRITIN: FERRITIN: 107 ng/mL (ref 24–336)

## 2017-02-28 NOTE — Progress Notes (Signed)
Patient here today for follow up.  Patient states no new concerns today  

## 2017-03-05 ENCOUNTER — Inpatient Hospital Stay: Payer: BLUE CROSS/BLUE SHIELD

## 2017-04-02 ENCOUNTER — Encounter: Payer: Self-pay | Admitting: Gastroenterology

## 2017-04-02 ENCOUNTER — Ambulatory Visit: Payer: BLUE CROSS/BLUE SHIELD | Admitting: Gastroenterology

## 2017-04-02 DIAGNOSIS — K760 Fatty (change of) liver, not elsewhere classified: Secondary | ICD-10-CM

## 2017-04-02 NOTE — Progress Notes (Signed)
[name] MD, MRCP(U.K) [address]  [place]  [place], [place] [address]  Main: [phone]  Fax: [phone]   Primary Care Physician: [name], MD  Primary Gastroenterologist:  Dr. [name]   Chief Complaint  Patient presents with  . Results    HPI: Cole Lane is a 42 y.o. male   Summary of history : He is here today to follow up for C282Y/H63D genotype hemochromotosis. Patient follows with  Dr Tasia Catchings in HematologyHe is undergoing phlebotomy. Initially referred in 11/2016. First noted abnormality in LFT's in 09/26/16 . AST 51 otherwise all tests were normal . Tdap received 11/2016 , Flu shot 11/2016  Hep Bcab, Hbsab/ag,Hep A ab , HCV ab -negative . Ferritin 353, TIBC 70%, Hb 14.7, Platelet 219    Interval history   11/21/2016-  04/02/2016   02/15/17- RUQ USH- minimal sludge 02/2017- Ferritin 107  11/2016- Autoimmune work up was negative,HIV negative. Not immune to Hep A/B  Obtained 1/3 Hep A/B vaccines     Current Outpatient Medications  Medication Sig Dispense Refill  . fluticasone (FLONASE) 50 MCG/ACT nasal spray Place 2 sprays into both nostrils daily. (Patient not taking: Reported on 02/28/2017) 16 g 6  . ibuprofen (ADVIL,MOTRIN) 200 MG tablet Take 200 mg by mouth every 6 (six) hours as needed.     No current facility-administered medications for this visit.     Allergies as of 04/02/2017 - Review Complete 04/02/2017  Allergen Reaction Noted  . Penicillin g Other (See Comments) 11/17/2013    ROS:  General: Negative for anorexia, weight loss, fever, chills, fatigue, weakness. ENT: Negative for hoarseness, difficulty swallowing , nasal congestion. CV: Negative for chest pain, angina, palpitations, dyspnea on exertion, peripheral edema.  Respiratory: Negative for dyspnea at rest, dyspnea on exertion, cough, sputum, wheezing.  GI: See history of present illness. GU:  Negative for dysuria, hematuria, urinary incontinence, urinary  frequency, nocturnal urination.  Endo: Negative for unusual weight change.    Physical Examination:   BP 129/82 (BP Location: Left Arm, Patient Position: Sitting, Cuff Size: Normal)   Pulse 74   Ht 6\' 1"  (1.854 m)   Wt 160 lb 6.4 oz (72.8 kg)   BMI 21.16 kg/m   General: Well-nourished, well-developed in no acute distress.  Eyes: No icterus. Conjunctivae pink. Mouth: Oropharyngeal mucosa moist and pink , no lesions erythema or exudate. Lungs: Clear to auscultation bilaterally. Non-labored. Heart: Regular rate and rhythm, no murmurs rubs or gallops.  Abdomen: Bowel sounds are normal, nontender, nondistended, no hepatosplenomegaly or masses, no abdominal bruits or hernia , no rebound or guarding.   Extremities: No lower extremity edema. No clubbing or deformities. Neuro: Alert and oriented x 3.  Grossly intact. Skin: Warm and dry, no jaundice.   Psych: Alert and cooperative, normal mood and affect.   Imaging Studies: No results found.  Assessment and Plan:   [name] is a 42 y.o. y/o male here to follow up for  hereditary hemochromatosis  with a C282Y/H63D genotype ,  60 % have an intermediate degree of iron loading, and 35 percent have normal iron burdens. Patients who are  asymptomatic HH and ferritins <500 are at low risk for developing HH-related signs and symptoms. Doing well and uptodate with health maintenance    Plan: 1.Complete Hep A/B vaccine 2. Check INR, CMP today  3. Limit the intake of ethanol, avoid taking iron, vitamin C supplements, and avoid uncooked seafood 4. Phlebotomy per hematology recommendations 5. A fasting transferrin  saturation and ferritin concentration, along with testing for mutations in the HFE gene, should be obtained in all first-degree relatives as part of screening  6. NAFLD - patient information will be provided.   F/u in 6 months .   Dr [name]  MD,MRCP [facility]) Follow up in 6-8 months

## 2017-06-01 ENCOUNTER — Encounter: Payer: Self-pay | Admitting: Family Medicine

## 2017-06-01 ENCOUNTER — Ambulatory Visit: Payer: BLUE CROSS/BLUE SHIELD | Admitting: Family Medicine

## 2017-06-01 DIAGNOSIS — N281 Cyst of kidney, acquired: Secondary | ICD-10-CM | POA: Insufficient documentation

## 2017-06-01 DIAGNOSIS — G44229 Chronic tension-type headache, not intractable: Secondary | ICD-10-CM | POA: Diagnosis not present

## 2017-06-01 DIAGNOSIS — M79671 Pain in right foot: Secondary | ICD-10-CM | POA: Diagnosis not present

## 2017-06-01 DIAGNOSIS — R51 Headache: Secondary | ICD-10-CM

## 2017-06-01 DIAGNOSIS — R519 Headache, unspecified: Secondary | ICD-10-CM | POA: Insufficient documentation

## 2017-06-01 NOTE — Progress Notes (Signed)
Cole Rumps, MD Phone: [phone]  Cole Lane is a 42 y.o. male who presents today for f/u.  Hereditary hemochromatosis: Following with GI and hematology.  No right upper quadrant pain.  No alcohol intake or Tylenol intake.  He had an ultrasound that revealed gallbladder sludge and a kidney cyst.  They recommended that he follow-up with me regarding the kidney cyst.  He is not done phlebotomy in several months as his levels were good.  He had some right foot pain that lasted for about a week and a half after he did a lot of walking while in [place].  It would happen in the ball of his right foot at the MTP joint of the first toe.  It was a sharp pain that would last for a second and go away.  Occurred a couple times a day.  Exacerbated by movement after not moving for a while.  No discomfort currently.  Continues to have intermittent headaches that can be frontal or posterior.  These have been unchanged for some time now.  He tried the Flonase though that caused some nosebleeds so he stopped it.  No recurrent nosebleeds.  Occasionally some sinus pressure.  No photophobia or phonophobia.  Symptoms occur 1-2 times a month.  One ibuprofen resolves the symptoms.  Social History   Tobacco Use  Smoking Status Never Smoker  Smokeless Tobacco Never Used     ROS see history of present illness  Objective  Physical Exam Vitals:   06/01/17 0803  BP: 110/80  Pulse: 79  Resp: 18  Temp: (!) 97.5 F (36.4 C)  SpO2: 98%    BP Readings from Last 3 Encounters:  06/01/17 110/80  04/02/17 129/82  03/05/17 107/74   Wt Readings from Last 3 Encounters:  06/01/17 165 lb (74.8 kg)  04/02/17 160 lb 6.4 oz (72.8 kg)  02/28/17 159 lb 8 oz (72.3 kg)    Physical Exam  Constitutional: No distress.  Cardiovascular: Normal rate, regular rhythm and normal heart sounds.  Pulmonary/Chest: Effort normal and breath sounds normal.  Abdominal: Soft. Bowel sounds are normal. He exhibits no  distension and no mass. There is no tenderness.  Musculoskeletal: He exhibits no edema.  Bilateral feet with 2+ DP and PT pulses, no swelling, warmth, erythema, or tenderness over his forefoot or at his first MTP joint bilaterally, full range of motion bilateral great toes  Neurological: He is alert.  CN 2-12 intact, 5/5 strength in bilateral biceps, triceps, grip, quads, hamstrings, plantar and dorsiflexion, sensation to light touch intact in bilateral UE and LE, normal gait   Skin: Skin is warm and dry. He is not diaphoretic.     Assessment/Plan: Please see individual problem list.  Hereditary hemochromatosis ([place]) Stable.  He is following with hematology and GI.  Reports fatty liver as well.  He is avoiding alcohol and Tylenol.  Headache Chronic tension type headache.  He does note occasional fairly severe headaches which brings into question possible migraines.  Only occurs 1-2 times a month and resolves with minimal ibuprofen.  He will continue to monitor at this time.  Given return precautions.  Right foot pain Suspect strain.  This has resolved.  Discussed icing if this recurs.  If it recurs he will let us know.  Renal cyst Discussed benign nature.  Discussed that we could potentially recheck this in a year with an ultrasound.   No orders of the defined types were placed in this encounter.   No orders of the defined  types were placed in this encounter.    Cole Rumps, MD [place]

## 2017-06-01 NOTE — Assessment & Plan Note (Signed)
Suspect strain.  This has resolved.  Discussed icing if this recurs.  If it recurs he will let us know.

## 2017-06-01 NOTE — Assessment & Plan Note (Signed)
Chronic tension type headache.  He does note occasional fairly severe headaches which brings into question possible migraines.  Only occurs 1-2 times a month and resolves with minimal ibuprofen.  He will continue to monitor at this time.  Given return precautions.

## 2017-06-01 NOTE — Assessment & Plan Note (Signed)
Discussed benign nature.  Discussed that we could potentially recheck this in a year with an ultrasound.

## 2017-06-01 NOTE — Patient Instructions (Signed)
Nice to see you. Please monitor your headaches and foot pain. If they change or become persistent please let us know.

## 2017-06-01 NOTE — Assessment & Plan Note (Signed)
Stable.  He is following with hematology and GI.  Reports fatty liver as well.  He is avoiding alcohol and Tylenol.

## 2017-06-19 ENCOUNTER — Ambulatory Visit: Payer: BLUE CROSS/BLUE SHIELD

## 2017-06-26 ENCOUNTER — Ambulatory Visit: Payer: BLUE CROSS/BLUE SHIELD

## 2017-06-27 ENCOUNTER — Ambulatory Visit (INDEPENDENT_AMBULATORY_CARE_PROVIDER_SITE_OTHER): Payer: BLUE CROSS/BLUE SHIELD

## 2017-06-27 DIAGNOSIS — Z23 Encounter for immunization: Secondary | ICD-10-CM | POA: Diagnosis not present

## 2017-06-27 NOTE — Progress Notes (Signed)
Patient given last of series of hepA/hep B in left deltoid patient tolerated well.

## 2017-07-02 ENCOUNTER — Encounter: Payer: Self-pay | Admitting: Oncology

## 2017-08-30 ENCOUNTER — Inpatient Hospital Stay: Payer: BLUE CROSS/BLUE SHIELD | Attending: Oncology

## 2017-08-30 LAB — CBC WITH DIFFERENTIAL/PLATELET
BASOS PCT: 1 %
Basophils Absolute: 0.1 10*3/uL (ref 0–0.1)
Eosinophils Absolute: 0.1 10*3/uL (ref 0–0.7)
Eosinophils Relative: 1 %
HEMATOCRIT: 39.4 % — AB (ref 40.0–52.0)
Hemoglobin: 13.5 g/dL (ref 13.0–18.0)
LYMPHS ABS: 2 10*3/uL (ref 1.0–3.6)
Lymphocytes Relative: 30 %
MCH: 30.7 pg (ref 26.0–34.0)
MCHC: 34.3 g/dL (ref 32.0–36.0)
MCV: 89.4 fL (ref 80.0–100.0)
MONO ABS: 0.5 10*3/uL (ref 0.2–1.0)
MONOS PCT: 7 %
Neutro Abs: 4 10*3/uL (ref 1.4–6.5)
Neutrophils Relative %: 61 %
Platelets: 190 10*3/uL (ref 150–440)
RBC: 4.41 MIL/uL (ref 4.40–5.90)
RDW: 12.8 % (ref 11.5–14.5)
WBC: 6.6 10*3/uL (ref 3.8–10.6)

## 2017-08-30 LAB — IRON AND TIBC
IRON: 133 ug/dL (ref 45–182)
Saturation Ratios: 44 % — ABNORMAL HIGH (ref 17.9–39.5)
TIBC: 301 ug/dL (ref 250–450)
UIBC: 168 ug/dL

## 2017-08-30 LAB — FERRITIN: Ferritin: 62 ng/mL (ref 24–336)

## 2017-08-31 ENCOUNTER — Inpatient Hospital Stay: Payer: BLUE CROSS/BLUE SHIELD

## 2017-08-31 ENCOUNTER — Encounter: Payer: Self-pay | Admitting: Oncology

## 2017-08-31 ENCOUNTER — Other Ambulatory Visit: Payer: Self-pay

## 2017-08-31 ENCOUNTER — Inpatient Hospital Stay (HOSPITAL_BASED_OUTPATIENT_CLINIC_OR_DEPARTMENT_OTHER): Payer: BLUE CROSS/BLUE SHIELD | Admitting: Oncology

## 2017-08-31 NOTE — Progress Notes (Signed)
Patient here for follow up. No concerns voiced.  °

## 2017-09-01 NOTE — Progress Notes (Signed)
Hematology/Oncology Follow Up Visit [facility] Telephone:([phone] Fax:(336) [phone]   Patient Care Team: [name], MD as PCP - General (Family Medicine)  Referring Physician Dr.Sonnernberg.Eric  REASON FOR VISIT Follow up for treatment of hemochromatosis and iron overload  PERTINENT HEMATOLOGY HISTORY Cole Lane 42 y.o.  male with past medical history listed as per low who was referred by Dr. 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 Patient denies any family history of hemochromatosis. He is eating history his health state. He feels well at baseline except sometimes if he stands up pretty fast for dizziness. He has been following up with primary care physician and has been noticed that his liver function tests has been mildly elevated. Additional workup showed high ferritin 353, also high transferrin saturation of 70%. 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 He is married and has no children yet.  He follows up with GI Dr.Anna and has ongoing work up. US abdomen was suggested and has not been done yet.  # Discussed about avoiding alcohol, and uncooked seafood.  #Discussed about screening first degree relative for hereditary hemochromatosis.   INTERVAL HISTORY Cole Lane is a 42 y.o. male who has above history reviewed by me today present for follow-up visit for management of hereditary hemochromatosis. During the interval, patient reports doing well.  He does not have any complaints today.   ROS:  Review of Systems  Constitutional: Negative for appetite change, chills, diaphoresis, fatigue, fever and unexpected weight change.  HENT:   Negative for hearing loss, lump/mass, mouth sores, nosebleeds and sore throat.   Eyes: Negative.  Negative for eye problems and icterus.  Respiratory: Negative for chest tightness, cough, hemoptysis, shortness of  breath and wheezing.   Cardiovascular: Negative for chest pain and leg swelling.  Gastrointestinal: Negative for abdominal distention, abdominal pain, blood in stool, diarrhea, nausea and rectal pain.  Endocrine: Negative for hot flashes.  Genitourinary: Negative for bladder incontinence, difficulty urinating, dysuria, frequency, hematuria and nocturia.   Musculoskeletal: Negative for arthralgias, back pain, flank pain, gait problem and myalgias.  Skin: Negative for itching and rash.  Neurological: Negative for dizziness, gait problem, headaches, numbness and seizures.  Hematological: Negative for adenopathy. Does not bruise/bleed easily.  Psychiatric/Behavioral: Negative for confusion and decreased concentration. The patient is not nervous/anxious.     MEDICAL HISTORY:  Past Medical History:  Diagnosis Date  . Allergic rhinitis   . Allergy   . Asthma   . Kidney stone   . Palpitation     SURGICAL HISTORY: Past Surgical History:  Procedure Laterality Date  . DERMOID CYST  EXCISION      SOCIAL HISTORY: Social History   Socioeconomic History  . Marital status: Single    Spouse name: Not on file  . Number of children: Not on file  . Years of education: Not on file  . Highest education level: Not on file  Occupational History  . Not on file  Social Needs  . Financial resource strain: Not on file  . Food insecurity:    Worry: Not on file    Inability: Not on file  . Transportation needs:    Medical: Not on file    Non-medical: Not on file  Tobacco Use  . Smoking status: Never Smoker  . Smokeless tobacco: Never Used  Substance and Sexual Activity  . Alcohol use: No  . Drug use: No  . Sexual activity: Yes  Lifestyle  .  Physical activity:    Days per week: Not on file    Minutes per session: Not on file  . Stress: Not on file  Relationships  . Social connections:    Talks on phone: Not on file    Gets together: Not on file    Attends religious service: Not on  file    Active member of club or organization: Not on file    Attends meetings of clubs or organizations: Not on file    Relationship status: Not on file  . Intimate partner violence:    Fear of current or ex partner: Not on file    Emotionally abused: Not on file    Physically abused: Not on file    Forced sexual activity: Not on file  Other Topics Concern  . Not on file  Social History Narrative  . Not on file    FAMILY HISTORY: Family History  Problem Relation Age of Onset  . Asthma Maternal Aunt   . Hypertension Other        Parent  . Stroke Other        Grandparent  . Mental illness Other        Grandparent  . Arthritis Other        Grandparent  . Hypertension Father     ALLERGIES:  is allergic to penicillin g.  MEDICATIONS:  Current Outpatient Medications  Medication Sig Dispense Refill  . fluticasone (FLONASE) 50 MCG/ACT nasal spray Place 2 sprays into both nostrils daily. 16 g 6  . ibuprofen (ADVIL,MOTRIN) 200 MG tablet Take 200 mg by mouth every 6 (six) hours as needed.     No current facility-administered medications for this visit.       Marland Kitchen  PHYSICAL EXAMINATION: ECOG PERFORMANCE STATUS: 0 - Asymptomatic Vitals:   08/31/17 1330  BP: 114/78  Pulse: 76  Resp: 18  Temp: 98.3 F (36.8 C)  SpO2: 96%   Filed Weights   08/31/17 1330  Weight: 164 lb 3.2 oz (74.5 kg)   Physical Exam  Constitutional: He is oriented to person, place, and time and well-developed, well-nourished, and in no distress. No distress.  HENT:  Head: Normocephalic and atraumatic.  Nose: Nose normal.  Mouth/Throat: Oropharynx is clear and moist. No oropharyngeal exudate.  Eyes: Pupils are equal, round, and reactive to light. EOM are normal. Left eye exhibits no discharge. No scleral icterus.  Neck: Normal range of motion. Neck supple. No JVD present.  Cardiovascular: Normal rate, regular rhythm and normal heart sounds.  No murmur heard. Pulmonary/Chest: Effort normal and  breath sounds normal. No respiratory distress. He has no wheezes. He has no rales. He exhibits no tenderness.  Abdominal: Soft. Bowel sounds are normal. He exhibits no distension and no mass. There is no tenderness. There is no rebound.  Musculoskeletal: Normal range of motion. He exhibits no edema or tenderness.  Lymphadenopathy:    He has no cervical adenopathy.  Neurological: He is alert and oriented to person, place, and time. No cranial nerve deficit. He exhibits normal muscle tone. Coordination normal.  Skin: Skin is warm and dry. No rash noted. He is not diaphoretic. No erythema.  Psychiatric: Affect and judgment normal.   LABORATORY DATA:  I have reviewed the data as listed Lab Results  Component Value Date   WBC 6.6 08/30/2017   HGB 13.5 08/30/2017   HCT 39.4 (L) 08/30/2017   MCV 89.4 08/30/2017   PLT 190 08/30/2017   Recent Labs  09/26/16 1115 10/30/16 1103  NA  --  136  K  --  4.2  CL  --  100*  CO2  --  28  GLUCOSE  --  90  BUN  --  12  CREATININE  --  0.90  CALCIUM  --  9.9  GFRNONAA  --  >60  GFRAA  --  >60  PROT 7.9 8.2*  ALBUMIN 4.7 4.9  AST 51* 60*  ALT 47 50  ALKPHOS 63 72  BILITOT 0.8 0.9  BILIDIR 0.1  --    Iron/TIBC/Ferritin/ %Sat    Component Value Date/Time   IRON 133 08/30/2017 1414   TIBC 301 08/30/2017 1414   FERRITIN 62 08/30/2017 1414   IRONPCTSAT 44 (H) 08/30/2017 1414   IRONPCTSAT 70 (H) 09/26/2016 1115   Hepatitis Panel all negative.   DNA Mutation Analysis              RESULT: COMPOUND HETEROZYGOUS FOR THE C282Y AND H63D MUTATIONS           ASSESSMENT & PLAN:  1. Hereditary hemochromatosis ([place])   2. 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  Ferritin 62, iron saturation 44. Plan phlebotomy 210ml x1 today. Repeat CBC, ferritin, iron TIBC in 6 months. All questions were answered. The patient knows to call the clinic with any problems questions or  concerns. Orders Placed This Encounter  Procedures  . CBC with Differential/Platelet    Standing Status:   Future    Standing Expiration Date:   09/02/2018  . Ferritin    Standing Status:   Future    Standing Expiration Date:   09/02/2018  . CBC    Standing Status:   Future    Standing Expiration Date:   09/01/2018    Return of visit: CBC,  iron TIBC ferritin in 6 months, MD and possible Phlebotomy in 6 months.   Earlie Server, MD, PhD Hematology Oncology [facility] at [facility] Pager- [phone] 09/01/2017

## 2017-09-03 ENCOUNTER — Encounter: Payer: Self-pay | Admitting: Oncology

## 2017-10-29 ENCOUNTER — Telehealth: Payer: Self-pay | Admitting: Gastroenterology

## 2017-10-29 NOTE — Telephone Encounter (Signed)
PT called to schedule Fu for 10/16 he states he would like to make sure we were not supposed to do Labs or testing prior to apt. Please call pt

## 2017-10-31 NOTE — Telephone Encounter (Signed)
1. Hep A total ab , Hep Surface ab - can be done at the office when he gets here 2. LFT's,INR- can also be done when he comes in

## 2017-10-31 NOTE — Telephone Encounter (Signed)
See below. Do you want any labs prior to appt with you on 12/05/17?

## 2017-11-02 NOTE — Telephone Encounter (Signed)
Pt advised. He will wait until appt to have these done.

## 2017-12-05 ENCOUNTER — Encounter: Payer: Self-pay | Admitting: Gastroenterology

## 2017-12-05 ENCOUNTER — Ambulatory Visit: Payer: BLUE CROSS/BLUE SHIELD | Admitting: Gastroenterology

## 2017-12-05 NOTE — Patient Instructions (Signed)
Nonalcoholic Fatty Liver Disease Diet Nonalcoholic fatty liver disease is a condition that causes fat to accumulate in and around the liver. The disease makes it harder for the liver to work the way that it should. Following a healthy diet can help to keep nonalcoholic fatty liver disease under control. It can also help to prevent or improve conditions that are associated with the disease, such as heart disease, diabetes, high blood pressure, and abnormal cholesterol levels. Along with regular exercise, this diet:  Promotes weight loss.  Helps to control blood sugar levels.  Helps to improve the way that the body uses insulin.  What do I need to know about this diet?  Use the glycemic index (GI) to plan your meals. The index tells you how quickly a food will raise your blood sugar. Choose low-GI foods. These foods take a longer time to raise blood sugar.  Keep track of how many calories you take in. Eating the right amount of calories will help you to achieve a healthy weight.  You may want to follow a Mediterranean diet. This diet includes a lot of vegetables, lean meats or fish, whole grains, fruits, and healthy oils and fats. What foods can I eat? Grains Whole grains, such as whole-wheat or whole-grain breads, crackers, tortillas, cereals, and pasta. Stone-ground whole wheat. Pumpernickel bread. Unsweetened oatmeal. Bulgur. Barley. Quinoa. Brown or wild rice. Corn or whole-wheat flour tortillas. Vegetables Lettuce. Spinach. Peas. Beets. Cauliflower. Cabbage. Broccoli. Carrots. Tomatoes. Squash. Eggplant. Herbs. Peppers. Onions. Cucumbers. Brussels sprouts. Yams and sweet potatoes. Beans. Lentils. Fruits Bananas. Apples. Oranges. Grapes. Papaya. Mango. Pomegranate. Kiwi. Grapefruit. Cherries. Meats and Other Protein Sources Seafood and shellfish. Lean meats. Poultry. Tofu. Dairy Low-fat or fat-free dairy products, such as yogurt, cottage cheese, and cheese. Beverages Water. Sugar-free  drinks. Tea. Coffee. Low-fat or skim milk. Milk alternatives, such as soy or almond milk. Real fruit juice. Condiments Mustard. Relish. Low-fat, low-sugar ketchup and barbecue sauce. Low-fat or fat-free mayonnaise. Sweets and Desserts Sugar-free sweets. Fats and Oils Avocado. Canola or olive oil. Nuts and nut butters. Seeds. The items listed above may not be a complete list of recommended foods or beverages. Contact your dietitian for more options. What foods are not recommended? Palm oil and coconut oil. Processed foods. Fried foods. Sweetened drinks, such as sweet tea, milkshakes, snow cones, iced sweet drinks, and sodas. Alcohol. Sweets. Foods that contain a lot of salt or sodium. The items listed above may not be a complete list of foods and beverages to avoid. Contact your dietitian for more information. This information is not intended to replace advice given to you by your health care provider. Make sure you discuss any questions you have with your health care provider. Document Released: 06/23/2014 Document Revised: 07/15/2015 Document Reviewed: 03/03/2014 Elsevier Interactive Patient Education  2018 Elsevier Inc.  

## 2017-12-05 NOTE — Progress Notes (Signed)
Cole Bellows MD, MRCP(U.K) [address]  [place]  [place], [place] [address]  Main: [phone]  Fax: [phone]   Primary Care Physician: [name], MD  Primary Gastroenterologist:  Dr. Jonathon Lane   Chief Complaint  Patient presents with  . Follow-up    Hereditary hemochromatosis    HPI: Cole Lane is a 42 y.o. male  Summary of history : He is here today to follow up for C282Y/H63D genotypehemochromotosis. Patient follows with  Dr Tasia Catchings in HematologyHe is undergoing phlebotomy. Initially referred in 11/2016. First noted abnormality in LFT's in8/7/18. AST 51 otherwise all tests were normal. Tdap received 11/2016 , Flu shot 11/2016  Hep Bcab, Hbsab/ag,Hep A ab , HCV ab -negative . Ferritin 353, TIBC 70%, Hb 14.7, Platelet 219  02/15/17- RUQ USG- minimal sludge 02/2017- Ferritin 107  11/2016- Autoimmune work up was negative,HIV negative. Not immune to Hep A/B  Interval history 04/02/2017-12/05/17   Hepatic Function Latest Ref Rng & Units 10/30/2016 09/26/2016  Total Protein 6.5 - 8.1 g/dL 8.2(H) 7.9  Albumin 3.5 - 5.0 g/dL 4.9 4.7  AST 15 - 41 U/L 60(H) 51(H)  ALT 17 - 63 U/L 50 47  Alk Phosphatase 38 - 126 U/L 72 63  Total Bilirubin 0.3 - 1.2 mg/dL 0.9 0.8  Bilirubin, Direct 0.0 - 0.3 mg/dL - 0.1     Completed Hep A/B vaccines   08/2017 : Ferritin 62, Hb 13.5  No new issues- doing well . No symptoms     Current Outpatient Medications  Medication Sig Dispense Refill  . fluticasone (FLONASE) 50 MCG/ACT nasal spray Place 2 sprays into both nostrils daily. 16 g 6  . ibuprofen (ADVIL,MOTRIN) 200 MG tablet Take 200 mg by mouth every 6 (six) hours as needed.     No current facility-administered medications for this visit.     Allergies as of 12/05/2017 - Review Complete 12/05/2017  Allergen Reaction Noted  . Penicillin g Other (See Comments) 11/17/2013    ROS:  General: Negative for anorexia, weight loss, fever, chills, fatigue,  weakness. ENT: Negative for hoarseness, difficulty swallowing , nasal congestion. CV: Negative for chest pain, angina, palpitations, dyspnea on exertion, peripheral edema.  Respiratory: Negative for dyspnea at rest, dyspnea on exertion, cough, sputum, wheezing.  GI: See history of present illness. GU:  Negative for dysuria, hematuria, urinary incontinence, urinary frequency, nocturnal urination.  Endo: Negative for unusual weight change.    Physical Examination:   BP 104/69   Pulse 63   Ht 6' 1"  (1.854 m)   Wt 164 lb 3.2 oz (74.5 kg)   BMI 21.66 kg/m   General: Well-nourished, well-developed in no acute distress.  Eyes: No icterus. Conjunctivae pink. Mouth: Oropharyngeal mucosa moist and pink , no lesions erythema or exudate. Lungs: Clear to auscultation bilaterally. Non-labored. Heart: Regular rate and rhythm, no murmurs rubs or gallops.  Abdomen: Bowel sounds are normal, nontender, nondistended, no hepatosplenomegaly or masses, no abdominal bruits or hernia , no rebound or guarding.   Extremities: No lower extremity edema. No clubbing or deformities. Neuro: Alert and oriented x 3.  Grossly intact. Skin: Warm and dry, no jaundice.   Psych: Alert and cooperative, normal mood and affect.   Imaging Studies: No results found.  Assessment and Plan:   Cole Lane is a 42 y.o. y/o male here to follow up for  hereditary hemochromatosis with a C282Y/H63D genotype,60 %have an intermediate degree of iron loading, and 35 percent have normal iron burdens. Patients who areasymptomatic  HH and ferritins <500 are at low risk for developing HH-related signs and symptoms. Doing well and uptodate with health maintenance    Plan: 1.Check INR, CMP , check Hep A/ B immune status.  2. Limit the intake of ethanol, avoid taking iron, vitamin Csupplements, and avoid uncooked seafood 3. Phlebotomy per hematology recommendations 4. Afasting transferrin saturation and ferritin  concentration, along with testing for mutations in the HFEgene, should be obtained in all first-degree relatives as part of screening 5. NAFLD - patient information will be provided.     Dr Cole Bellows  MD,MRCP [facility]) Follow up in 1 year.

## 2018-03-06 ENCOUNTER — Inpatient Hospital Stay: Payer: BLUE CROSS/BLUE SHIELD | Attending: Oncology

## 2018-03-06 LAB — HEPATIC FUNCTION PANEL
ALK PHOS: 72 U/L (ref 38–126)
ALT: 43 U/L (ref 0–44)
AST: 52 U/L — ABNORMAL HIGH (ref 15–41)
Albumin: 4.6 g/dL (ref 3.5–5.0)
Bilirubin, Direct: <0.1 mg/dL (ref 0.0–0.2)
TOTAL PROTEIN: 7.8 g/dL (ref 6.5–8.1)
Total Bilirubin: 0.6 mg/dL (ref 0.3–1.2)

## 2018-03-06 LAB — CBC WITH DIFFERENTIAL/PLATELET
Abs Immature Granulocytes: 0.02 10*3/uL (ref 0.00–0.07)
BASOS PCT: 1 %
Basophils Absolute: 0.1 10*3/uL (ref 0.0–0.1)
EOS ABS: 0.2 10*3/uL (ref 0.0–0.5)
EOS PCT: 3 %
HCT: 41.9 % (ref 39.0–52.0)
Hemoglobin: 13.4 g/dL (ref 13.0–17.0)
IMMATURE GRANULOCYTES: 0 %
Lymphocytes Relative: 29 %
Lymphs Abs: 1.9 10*3/uL (ref 0.7–4.0)
MCH: 29 pg (ref 26.0–34.0)
MCHC: 32 g/dL (ref 30.0–36.0)
MCV: 90.7 fL (ref 80.0–100.0)
MONOS PCT: 8 %
Monocytes Absolute: 0.5 10*3/uL (ref 0.1–1.0)
NEUTROS PCT: 59 %
NRBC: 0 % (ref 0.0–0.2)
Neutro Abs: 4.1 10*3/uL (ref 1.7–7.7)
PLATELETS: 199 10*3/uL (ref 150–400)
RBC: 4.62 MIL/uL (ref 4.22–5.81)
RDW: 11.9 % (ref 11.5–15.5)
WBC: 6.8 10*3/uL (ref 4.0–10.5)

## 2018-03-06 LAB — FERRITIN: FERRITIN: 66 ng/mL (ref 24–336)

## 2018-03-08 ENCOUNTER — Inpatient Hospital Stay: Payer: BLUE CROSS/BLUE SHIELD

## 2018-03-08 ENCOUNTER — Other Ambulatory Visit: Payer: BLUE CROSS/BLUE SHIELD

## 2018-03-08 ENCOUNTER — Encounter: Payer: Self-pay | Admitting: Oncology

## 2018-03-08 ENCOUNTER — Inpatient Hospital Stay (HOSPITAL_BASED_OUTPATIENT_CLINIC_OR_DEPARTMENT_OTHER): Payer: BLUE CROSS/BLUE SHIELD | Admitting: Oncology

## 2018-03-08 ENCOUNTER — Other Ambulatory Visit: Payer: Self-pay

## 2018-03-08 NOTE — Progress Notes (Signed)
Patient here for follow up. No concerns voiced.  °

## 2018-03-08 NOTE — Progress Notes (Signed)
No phlebotomy today per Dr Yu. °

## 2018-03-10 NOTE — Progress Notes (Signed)
Hematology/Oncology Follow Up Visit [facility] Telephone:([phone] Fax:(336) [phone]   Patient Care Team: [name], MD as PCP - General (Family Medicine)  Referring Physician Dr.Sonnernberg.Eric  REASON FOR VISIT Follow up for treatment of hemochromatosis and iron overload  PERTINENT HEMATOLOGY HISTORY Cole Lane 43 y.o.  male with past medical history listed as per low who was referred by Dr. 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 Patient denies any family history of hemochromatosis. He is eating history his health state. He feels well at baseline except sometimes if he stands up pretty fast for dizziness. He has been following up with primary care physician and has been noticed that his liver function tests has been mildly elevated. Additional workup showed high ferritin 353, also high transferrin saturation of 70%. 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 He is married and has no children yet.  He follows up with GI Dr.Anna and has ongoing work up. US abdomen was suggested and has not been done yet.  # Discussed about avoiding alcohol, and uncooked seafood.  #Discussed about screening first degree relative for hereditary hemochromatosis.   INTERVAL HISTORY Cole Lane is a 43 y.o. male who has above history reviewed by me today present for follow-up visit for management of hereditary hemochromatosis. He reports feeling well during the interval.  No new complaints today. Denies any fatigue, joint pain, shortness of breath, abdominal pain, lower extremity swelling, .   ROS:  Review of Systems  Constitutional: Negative for appetite change, chills, fatigue, fever and unexpected weight change.  HENT:   Negative for hearing loss and voice change.   Eyes: Negative for eye problems and icterus.  Respiratory: Negative for chest tightness, cough and  shortness of breath.   Cardiovascular: Negative for chest pain and leg swelling.  Gastrointestinal: Negative for abdominal distention and abdominal pain.  Endocrine: Negative for hot flashes.  Genitourinary: Negative for difficulty urinating, dysuria and frequency.   Musculoskeletal: Negative for arthralgias.  Skin: Negative for itching and rash.  Neurological: Negative for light-headedness and numbness.  Hematological: Negative for adenopathy. Does not bruise/bleed easily.  Psychiatric/Behavioral: Negative for confusion.    MEDICAL HISTORY:  Past Medical History:  Diagnosis Date  . Allergic rhinitis   . Allergy   . Asthma   . Kidney stone   . Palpitation     SURGICAL HISTORY: Past Surgical History:  Procedure Laterality Date  . DERMOID CYST  EXCISION      SOCIAL HISTORY: Social History   Socioeconomic History  . Marital status: Single    Spouse name: Not on file  . Number of children: Not on file  . Years of education: Not on file  . Highest education level: Not on file  Occupational History  . Not on file  Social Needs  . Financial resource strain: Not on file  . Food insecurity:    Worry: Not on file    Inability: Not on file  . Transportation needs:    Medical: Not on file    Non-medical: Not on file  Tobacco Use  . Smoking status: Never Smoker  . Smokeless tobacco: Never Used  Substance and Sexual Activity  . Alcohol use: No  . Drug use: No  . Sexual activity: Yes  Lifestyle  . Physical activity:    Days per week: Not on file    Minutes per session: Not on file  . Stress: Not on file  Relationships  .  Social connections:    Talks on phone: Not on file    Gets together: Not on file    Attends religious service: Not on file    Active member of club or organization: Not on file    Attends meetings of clubs or organizations: Not on file    Relationship status: Not on file  . Intimate partner violence:    Fear of current or ex partner: Not on file      Emotionally abused: Not on file    Physically abused: Not on file    Forced sexual activity: Not on file  Other Topics Concern  . Not on file  Social History Narrative  . Not on file    FAMILY HISTORY: Family History  Problem Relation Age of Onset  . Asthma Maternal Aunt   . Hypertension Other        Parent  . Stroke Other        Grandparent  . Mental illness Other        Grandparent  . Arthritis Other        Grandparent  . Hypertension Father     ALLERGIES:  is allergic to penicillin g.  MEDICATIONS:  Current Outpatient Medications  Medication Sig Dispense Refill  . ibuprofen (ADVIL,MOTRIN) 200 MG tablet Take 200 mg by mouth every 6 (six) hours as needed.     No current facility-administered medications for this visit.       Marland Kitchen  PHYSICAL EXAMINATION: ECOG PERFORMANCE STATUS: 0 - Asymptomatic Vitals:   03/08/18 1307  BP: 114/79  Pulse: 84  Resp: 18  Temp: (!) 97.5 F (36.4 C)   Filed Weights   03/08/18 1307  Weight: 165 lb 11.2 oz (75.2 kg)   Physical Exam  Constitutional: He is oriented to person, place, and time and well-developed, well-nourished, and in no distress. No distress.  HENT:  Head: Normocephalic and atraumatic.  Nose: Nose normal.  Mouth/Throat: Oropharynx is clear and moist. No oropharyngeal exudate.  Eyes: Pupils are equal, round, and reactive to light. EOM are normal. Left eye exhibits no discharge. No scleral icterus.  Neck: Normal range of motion. Neck supple. No JVD present.  Cardiovascular: Normal rate, regular rhythm and normal heart sounds.  No murmur heard. Pulmonary/Chest: Effort normal and breath sounds normal. No respiratory distress. He has no wheezes. He has no rales. He exhibits no tenderness.  Abdominal: Soft. Bowel sounds are normal. He exhibits no distension and no mass. There is no abdominal tenderness. There is no rebound.  Musculoskeletal: Normal range of motion.        General: No tenderness or edema.   Lymphadenopathy:    He has no cervical adenopathy.  Neurological: He is alert and oriented to person, place, and time. No cranial nerve deficit. He exhibits normal muscle tone. Coordination normal.  Skin: Skin is warm and dry. No rash noted. He is not diaphoretic. No erythema.  Psychiatric: Affect and judgment normal.   LABORATORY DATA:  I have reviewed the data as listed Lab Results  Component Value Date   WBC 6.8 03/06/2018   HGB 13.4 03/06/2018   HCT 41.9 03/06/2018   MCV 90.7 03/06/2018   PLT 199 03/06/2018   Recent Labs    03/06/18 1403  PROT 7.8  ALBUMIN 4.6  AST 52*  ALT 43  ALKPHOS 72  BILITOT 0.6  BILIDIR <0.1  IBILI NOT CALCULATED   Iron/TIBC/Ferritin/ %Sat    Component Value Date/Time   IRON 133 08/30/2017 1414  TIBC 301 08/30/2017 1414   FERRITIN 66 03/06/2018 1403   IRONPCTSAT 44 (H) 08/30/2017 1414   IRONPCTSAT 70 (H) 09/26/2016 1115   Hepatitis Panel all negative.   DNA Mutation Analysis              RESULT: COMPOUND HETEROZYGOUS FOR THE C282Y AND H63D MUTATIONS           ASSESSMENT & PLAN:  1. 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  Ferritin and 66.  Iron, TIBC was not obtained.  Hemoglobin stable.  Patient is asymptomatic.  Liver function showed mild elevated AST, normal ALT.  Normal bilirubin. Proceed with phlebotomy 250 ml x 1.  Repeat CBC, ferritin, iron, TIBC in 6 months. All questions were answered. The patient knows to call the clinic with any problems questions or concerns. Orders Placed This Encounter  Procedures  . CBC with Differential/Platelet    Standing Status:   Future    Standing Expiration Date:   03/08/2019  . Iron and TIBC    Standing Status:   Future    Standing Expiration Date:   03/09/2019  . Ferritin    Standing Status:   Future    Standing Expiration Date:   03/09/2019    Return of visit: CBC,  iron TIBC ferritin in 6 months,  MD and possible Phlebotomy in 6 months.  Total face to face encounter time for this patient visit was 15 min. >50% of the time was  spent in counseling and coordination of care.  Earlie Server, MD, PhD Hematology Oncology [facility] at [facility] Pager- [phone] 03/10/2018

## 2018-03-18 ENCOUNTER — Inpatient Hospital Stay: Payer: BLUE CROSS/BLUE SHIELD

## 2018-06-03 ENCOUNTER — Encounter: Payer: BLUE CROSS/BLUE SHIELD | Admitting: Family Medicine

## 2018-09-13 ENCOUNTER — Inpatient Hospital Stay: Payer: BC Managed Care – PPO | Attending: Oncology

## 2018-09-13 ENCOUNTER — Other Ambulatory Visit: Payer: Self-pay

## 2018-09-13 DIAGNOSIS — R42 Dizziness and giddiness: Secondary | ICD-10-CM | POA: Insufficient documentation

## 2018-09-13 DIAGNOSIS — Z823 Family history of stroke: Secondary | ICD-10-CM | POA: Insufficient documentation

## 2018-09-13 DIAGNOSIS — R740 Nonspecific elevation of levels of transaminase and lactic acid dehydrogenase [LDH]: Secondary | ICD-10-CM | POA: Insufficient documentation

## 2018-09-13 DIAGNOSIS — Z880 Allergy status to penicillin: Secondary | ICD-10-CM | POA: Diagnosis not present

## 2018-09-13 DIAGNOSIS — Z79899 Other long term (current) drug therapy: Secondary | ICD-10-CM | POA: Diagnosis not present

## 2018-09-13 DIAGNOSIS — Z8261 Family history of arthritis: Secondary | ICD-10-CM | POA: Diagnosis not present

## 2018-09-13 DIAGNOSIS — Z825 Family history of asthma and other chronic lower respiratory diseases: Secondary | ICD-10-CM | POA: Diagnosis not present

## 2018-09-13 DIAGNOSIS — Z818 Family history of other mental and behavioral disorders: Secondary | ICD-10-CM | POA: Insufficient documentation

## 2018-09-13 DIAGNOSIS — Z8249 Family history of ischemic heart disease and other diseases of the circulatory system: Secondary | ICD-10-CM | POA: Diagnosis not present

## 2018-09-13 LAB — CBC WITH DIFFERENTIAL/PLATELET
Abs Immature Granulocytes: 0.01 10*3/uL (ref 0.00–0.07)
Basophils Absolute: 0 10*3/uL (ref 0.0–0.1)
Basophils Relative: 0 %
Eosinophils Absolute: 0.1 10*3/uL (ref 0.0–0.5)
Eosinophils Relative: 2 %
HCT: 40.7 % (ref 39.0–52.0)
Hemoglobin: 13.7 g/dL (ref 13.0–17.0)
Immature Granulocytes: 0 %
Lymphocytes Relative: 30 %
Lymphs Abs: 1.7 10*3/uL (ref 0.7–4.0)
MCH: 30.4 pg (ref 26.0–34.0)
MCHC: 33.7 g/dL (ref 30.0–36.0)
MCV: 90.4 fL (ref 80.0–100.0)
Monocytes Absolute: 0.5 10*3/uL (ref 0.1–1.0)
Monocytes Relative: 8 %
Neutro Abs: 3.3 10*3/uL (ref 1.7–7.7)
Neutrophils Relative %: 60 %
Platelets: 195 10*3/uL (ref 150–400)
RBC: 4.5 MIL/uL (ref 4.22–5.81)
RDW: 11.9 % (ref 11.5–15.5)
WBC: 5.6 10*3/uL (ref 4.0–10.5)
nRBC: 0 % (ref 0.0–0.2)

## 2018-09-13 LAB — IRON AND TIBC
Iron: 93 ug/dL (ref 45–182)
Saturation Ratios: 30 % (ref 17.9–39.5)
TIBC: 310 ug/dL (ref 250–450)
UIBC: 217 ug/dL

## 2018-09-13 LAB — FERRITIN: Ferritin: 69 ng/mL (ref 24–336)

## 2018-09-16 ENCOUNTER — Inpatient Hospital Stay: Payer: BC Managed Care – PPO

## 2018-09-16 ENCOUNTER — Other Ambulatory Visit: Payer: Self-pay

## 2018-09-16 ENCOUNTER — Inpatient Hospital Stay (HOSPITAL_BASED_OUTPATIENT_CLINIC_OR_DEPARTMENT_OTHER): Payer: BC Managed Care – PPO | Admitting: Oncology

## 2018-09-16 DIAGNOSIS — Z8261 Family history of arthritis: Secondary | ICD-10-CM | POA: Diagnosis not present

## 2018-09-16 DIAGNOSIS — Z825 Family history of asthma and other chronic lower respiratory diseases: Secondary | ICD-10-CM | POA: Diagnosis not present

## 2018-09-16 DIAGNOSIS — R740 Nonspecific elevation of levels of transaminase and lactic acid dehydrogenase [LDH]: Secondary | ICD-10-CM

## 2018-09-16 DIAGNOSIS — R42 Dizziness and giddiness: Secondary | ICD-10-CM

## 2018-09-16 DIAGNOSIS — Z880 Allergy status to penicillin: Secondary | ICD-10-CM | POA: Diagnosis not present

## 2018-09-16 DIAGNOSIS — R7401 Elevation of levels of liver transaminase levels: Secondary | ICD-10-CM

## 2018-09-16 DIAGNOSIS — Z818 Family history of other mental and behavioral disorders: Secondary | ICD-10-CM | POA: Diagnosis not present

## 2018-09-16 DIAGNOSIS — Z8249 Family history of ischemic heart disease and other diseases of the circulatory system: Secondary | ICD-10-CM

## 2018-09-16 DIAGNOSIS — Z79899 Other long term (current) drug therapy: Secondary | ICD-10-CM | POA: Diagnosis not present

## 2018-09-16 DIAGNOSIS — Z823 Family history of stroke: Secondary | ICD-10-CM | POA: Diagnosis not present

## 2018-09-16 NOTE — Progress Notes (Signed)
Patient does not offer any problems today and had labs drawn last Friday.

## 2018-09-17 ENCOUNTER — Encounter: Payer: Self-pay | Admitting: Family Medicine

## 2018-09-17 ENCOUNTER — Encounter: Payer: Self-pay | Admitting: Oncology

## 2018-09-17 ENCOUNTER — Ambulatory Visit (INDEPENDENT_AMBULATORY_CARE_PROVIDER_SITE_OTHER): Payer: BC Managed Care – PPO | Admitting: Family Medicine

## 2018-09-17 VITALS — BP 120/80 | HR 94 | Temp 98.8°F | Ht 72.0 in | Wt 161.1 lb

## 2018-09-17 DIAGNOSIS — Z0001 Encounter for general adult medical examination with abnormal findings: Secondary | ICD-10-CM

## 2018-09-17 DIAGNOSIS — G44229 Chronic tension-type headache, not intractable: Secondary | ICD-10-CM | POA: Diagnosis not present

## 2018-09-17 DIAGNOSIS — Z13220 Encounter for screening for lipoid disorders: Secondary | ICD-10-CM | POA: Diagnosis not present

## 2018-09-17 DIAGNOSIS — R413 Other amnesia: Secondary | ICD-10-CM

## 2018-09-17 DIAGNOSIS — R002 Palpitations: Secondary | ICD-10-CM | POA: Diagnosis not present

## 2018-09-17 LAB — COMPREHENSIVE METABOLIC PANEL
ALT: 23 U/L (ref 0–53)
AST: 36 U/L (ref 0–37)
Albumin: 4.8 g/dL (ref 3.5–5.2)
Alkaline Phosphatase: 61 U/L (ref 39–117)
BUN: 11 mg/dL (ref 6–23)
CO2: 29 mEq/L (ref 19–32)
Calcium: 9.7 mg/dL (ref 8.4–10.5)
Chloride: 103 mEq/L (ref 96–112)
Creatinine, Ser: 0.91 mg/dL (ref 0.40–1.50)
GFR: 92.01 mL/min (ref >60.00)
Glucose, Bld: 91 mg/dL (ref 70–99)
Potassium: 4.2 mEq/L (ref 3.5–5.1)
Sodium: 139 mEq/L (ref 135–145)
Total Bilirubin: 0.9 mg/dL (ref 0.2–1.2)
Total Protein: 7.5 g/dL (ref 6.0–8.3)

## 2018-09-17 LAB — LIPID PANEL
Cholesterol: 161 mg/dL (ref 0–200)
HDL: 42.1 mg/dL (ref >39.00)
LDL Cholesterol: 105 mg/dL — ABNORMAL HIGH (ref 0–99)
NonHDL: 118.42
Total CHOL/HDL Ratio: 4
Triglycerides: 65 mg/dL (ref 0.0–149.0)
VLDL: 13 mg/dL (ref 0.0–40.0)

## 2018-09-17 LAB — TSH: TSH: 1.35 u[IU]/mL (ref 0.35–4.50)

## 2018-09-17 NOTE — Progress Notes (Signed)
Hematology/Oncology Follow Up Visit [facility] [phone] [phone]   Patient Care Team: [name], MD as PCP - General (Family Medicine)  Referring Physician Dr.[email or web address]  REASON FOR VISIT Follow up for treatment of hemochromatosis and iron overload  PERTINENT HEMATOLOGY HISTORY Cole Lane 43 y.o.  male with past medical history listed as per low who was referred by Dr. 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 Patient denies any family history of hemochromatosis. He is eating history his health state. He feels well at baseline except sometimes if he stands up pretty fast for dizziness. He has been following up with primary care physician and has been noticed that his liver function tests has been mildly elevated. Additional workup showed high ferritin 353, also high transferrin saturation of 70%. 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 He is married and has no children yet.  He follows up with GI Dr.Anna and has ongoing work up. US abdomen was suggested and has not been done yet.  # Discussed about avoiding alcohol, and uncooked seafood.  #Discussed about screening first degree relative for hereditary hemochromatosis.   INTERVAL HISTORY Cole Lane is a 43 y.o. 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 Patient reports feeling well since last visit. No new complaints today. Denies any fatigue, joint pain, shortness of breath, abdominal pain, lower extremity swelling. During COVID-19 pandemic, he works from home.  .   ROS:  Review of Systems  Constitutional: Negative for appetite change, chills, diaphoresis, fatigue, fever and unexpected weight change.  HENT:   Negative for hearing loss, lump/mass,  nosebleeds, sore throat and voice change.   Eyes: Negative for eye problems and icterus.  Respiratory: Negative for chest tightness, cough, hemoptysis, shortness of breath and wheezing.   Cardiovascular: Negative for chest pain and leg swelling.  Gastrointestinal: Negative for abdominal distention, abdominal pain, blood in stool, diarrhea, nausea and rectal pain.  Endocrine: Negative for hot flashes.  Genitourinary: Negative for bladder incontinence, difficulty urinating, dysuria, frequency, hematuria and nocturia.   Musculoskeletal: Negative for arthralgias, back pain, flank pain, gait problem and myalgias.  Skin: Negative for itching and rash.  Neurological: Negative for dizziness, gait problem, headaches, light-headedness, numbness and seizures.  Hematological: Negative for adenopathy. Does not bruise/bleed easily.  Psychiatric/Behavioral: Negative for confusion and decreased concentration. The patient is not nervous/anxious.     MEDICAL HISTORY:  Past Medical History:  Diagnosis Date  . Allergic rhinitis   . Allergy   . Asthma   . Kidney stone   . Palpitation     SURGICAL HISTORY: Past Surgical History:  Procedure Laterality Date  . DERMOID CYST  EXCISION      SOCIAL HISTORY: Social History   Socioeconomic History  . Marital status: Single    Spouse name: Not on file  . Number of children: Not on file  . Years of education: Not on file  . Highest education level: Not on file  Occupational History  . Not on file  Social Needs  . Financial resource strain: Not on file  . Food insecurity    Worry: Not on file    Inability: Not on file  . Transportation needs    Medical: Not on file    Non-medical: Not on file  Tobacco Use  . Smoking status: Never Smoker  . Smokeless tobacco: Never Used  Substance and Sexual Activity  . Alcohol use: No  . Drug use: No  . Sexual activity: Yes  Lifestyle  . Physical activity    Days per week: Not on file    Minutes per  session: Not on file  . Stress: Not on file  Relationships  . Social Herbalist on phone: Not on file    Gets together: Not on file    Attends religious service: Not on file    Active member of club or organization: Not on file    Attends meetings of clubs or organizations: Not on file    Relationship status: Not on file  . Intimate partner violence    Fear of current or ex partner: Not on file    Emotionally abused: Not on file    Physically abused: Not on file    Forced sexual activity: Not on file  Other Topics Concern  . Not on file  Social History Narrative  . Not on file    FAMILY HISTORY: Family History  Problem Relation Age of Onset  . Asthma Maternal Aunt   . Hypertension Other        Parent  . Stroke Other        Grandparent  . Mental illness Other        Grandparent  . Arthritis Other        Grandparent  . Hypertension Father     ALLERGIES:  is allergic to penicillin g.  MEDICATIONS:  Current Outpatient Medications  Medication Sig Dispense Refill  . ibuprofen (ADVIL,MOTRIN) 200 MG tablet Take 200 mg by mouth every 6 (six) hours as needed.     No current facility-administered medications for this visit.       Marland Kitchen  PHYSICAL EXAMINATION: ECOG PERFORMANCE STATUS: 0 - Asymptomatic Vitals:   09/16/18 1332  BP: 117/75  Pulse: 80  Resp: 18  Temp: 99.4 F (37.4 C)   Filed Weights   09/16/18 1332  Weight: 162 lb 14.4 oz (73.9 kg)   Physical Exam  Constitutional: He is oriented to person, place, and time and well-developed, well-nourished, and in no distress. No distress.  HENT:  Head: Normocephalic and atraumatic.  Nose: Nose normal.  Mouth/Throat: Oropharynx is clear and moist. No oropharyngeal exudate.  Eyes: Pupils are equal, round, and reactive to light. EOM are normal. Left eye exhibits no discharge. No scleral icterus.  Neck: Normal range of motion. Neck supple. No JVD present.  Cardiovascular: Normal rate, regular rhythm and  normal heart sounds.  No murmur heard. Pulmonary/Chest: Effort normal and breath sounds normal. No respiratory distress. He has no wheezes. He has no rales. He exhibits no tenderness.  Abdominal: Soft. Bowel sounds are normal. He exhibits no distension and no mass. There is no abdominal tenderness. There is no rebound.  Musculoskeletal: Normal range of motion.        General: No tenderness or edema.  Lymphadenopathy:    He has no cervical adenopathy.  Neurological: He is alert and oriented to person, place, and time. No cranial nerve deficit. He exhibits normal muscle tone. Coordination normal.  Skin: Skin is warm and dry. No rash noted. He is not diaphoretic. No erythema.  Psychiatric: Affect and judgment normal.   LABORATORY DATA:  I have reviewed the data as listed Lab Results  Component Value Date   WBC 5.6 09/13/2018   HGB 13.7 09/13/2018   HCT 40.7 09/13/2018   MCV 90.4 09/13/2018   PLT 195  09/13/2018   Recent Labs    03/06/18 1403  PROT 7.8  ALBUMIN 4.6  AST 52*  ALT 43  ALKPHOS 72  BILITOT 0.6  BILIDIR <0.1  IBILI NOT CALCULATED   Iron/TIBC/Ferritin/ %Sat    Component Value Date/Time   IRON 93 09/13/2018 1316   TIBC 310 09/13/2018 1316   FERRITIN 69 09/13/2018 1316   IRONPCTSAT 30 09/13/2018 1316   IRONPCTSAT 70 (H) 09/26/2016 1115   Hepatitis Panel all negative.  DNA Mutation Analysis              RESULT: COMPOUND HETEROZYGOUS FOR THE C282Y AND H63D MUTATIONS   RADIOGRAPHIC STUDIES: I have personally reviewed the radiological images as listed and agreed with the findings in the report. 02/08/2017 abdomen ultrasoundshowed no focal lesion in the liver.  Within normal limits in parenchymal echogenicity.  Right renal cyst.      ASSESSMENT & PLAN:  1. Hereditary hemochromatosis ([place])   2. 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 Ferritin has increased to 69, saturation 30.  Recommend proceed with 250 cc phlebotomy today. Avoid alcohol.  Transaminitis, he had a history of mild elevated AST.  We will check liver function at next visit. Repeat CBC, ferritin, iron, TIBC in 6 months. All questions were answered. The patient knows to call the clinic with any problems questions or concerns. Orders Placed This Encounter  Procedures  . CBC with Differential/Platelet    Standing Status:   Future    Standing Expiration Date:   09/16/2019  . Iron and TIBC    Standing Status:   Future    Standing Expiration Date:   09/16/2019  . Ferritin    Standing Status:   Future    Standing Expiration Date:   09/16/2019    Return of visit: CBC,  iron TIBC ferritin in 6 months, MD and possible Phlebotomy in 6 months.   Earlie Server, MD, PhD Hematology Oncology [place] at [facility] 09/17/2018

## 2018-09-17 NOTE — Patient Instructions (Signed)
Nice to see you.  We will get labs today.  If you have palpitations that are persistent or worsening please be looked out.

## 2018-09-20 ENCOUNTER — Telehealth: Payer: Self-pay | Admitting: Family Medicine

## 2018-09-20 DIAGNOSIS — R002 Palpitations: Secondary | ICD-10-CM

## 2018-09-20 NOTE — Telephone Encounter (Signed)
Pt. Returned call for lab results.  Given information per result note of Dr. Caryl Bis, from 09/19/18.  Verb. Understanding.  Pt. Stated he would like to be referred to Cardiology.  Advised will send message to PCP.  (documented in TE, as result note not sent to NT)

## 2018-09-20 NOTE — Addendum Note (Signed)
Addended by: Caryl Bis, Fayne Mcguffee G on: 09/20/2018 12:11 PM   Modules accepted: Orders

## 2018-09-20 NOTE — Telephone Encounter (Signed)
Referral placed.

## 2018-09-22 DIAGNOSIS — Z0001 Encounter for general adult medical examination with abnormal findings: Secondary | ICD-10-CM | POA: Insufficient documentation

## 2018-09-22 DIAGNOSIS — R413 Other amnesia: Secondary | ICD-10-CM | POA: Insufficient documentation

## 2018-09-22 DIAGNOSIS — Z0000 Encounter for general adult medical examination without abnormal findings: Secondary | ICD-10-CM | POA: Insufficient documentation

## 2018-09-22 HISTORY — DX: Other amnesia: R41.3

## 2018-09-22 NOTE — Progress Notes (Signed)
[name], MD Phone: [phone]  Cole Lane is a 43 y.o. male who presents today for CPE.  Exercise: Not getting as much recently.  He is doing some basic exercises at home and walking some in his neighborhood. Diet: Gets a good variety of foods.  Eats home-cooked meals with vegetables.  Not too many sweets or junk food. Tetanus vaccine up-to-date. HIV screening up-to-date. No family history of prostate cancer or colon cancer. No tobacco use, alcohol use, or illicit drug use. Sees a dentist twice yearly. Due to see an ophthalmologist.   Palpitations: Notes these have been going on for years though have become much more frequent recently.  Feels as though it occurs if he sits for a long period of time.  Feels like a spasm in his left lower chest.  Can occur every other day.  No chest pain or shortness of breath though he did have an episode in February after taking a trip where he had some pressure sensation in his chest with fatigue for 2 to 3 days.  Headaches: Patient notes there have been no change to these.  They have been going on for years and typically occur in the morning.  He can feel fluid shift within his sinuses with this.  He does have a history of allergies and he typically takes Claritin in the spring.  He gets headaches 2-3 times a month.  Memory difficulty: Patient reports issues remembering people's names though notes this typically only occurs under pressure.  He reports no other memory difficulty.  Active Ambulatory Problems    Diagnosis Date Noted  . Elevated LFTs 09/26/2016  . Skin lesions 09/26/2016  . Palpitations 09/26/2016  . Allergic rhinitis 09/26/2016  . Vitamin D deficiency 09/26/2016  . Hereditary hemochromatosis ([place]) 10/29/2016  . Iron overload 10/29/2016  . Headache 06/01/2017  . Right foot pain 06/01/2017  . Renal cyst 06/01/2017  . Encounter for general adult medical examination with abnormal findings 09/22/2018  . Memory difficulty  09/22/2018   Resolved Ambulatory Problems    Diagnosis Date Noted  . No Resolved Ambulatory Problems   Past Medical History:  Diagnosis Date  . Allergy   . Asthma   . Kidney stone   . Palpitation     Family History  Problem Relation Age of Onset  . Asthma Maternal Aunt   . Hypertension Other        Parent  . Stroke Other        Grandparent  . Mental illness Other        Grandparent  . Arthritis Other        Grandparent  . Hypertension Father     Social History   Socioeconomic History  . Marital status: Single    Spouse name: Not on file  . Number of children: Not on file  . Years of education: Not on file  . Highest education level: Not on file  Occupational History  . Not on file  Social Needs  . Financial resource strain: Not on file  . Food insecurity    Worry: Not on file    Inability: Not on file  . Transportation needs    Medical: Not on file    Non-medical: Not on file  Tobacco Use  . Smoking status: Never Smoker  . Smokeless tobacco: Never Used  Substance and Sexual Activity  . Alcohol use: No  . Drug use: No  . Sexual activity: Yes  Lifestyle  . Physical activity  Days per week: Not on file    Minutes per session: Not on file  . Stress: Not on file  Relationships  . Social connections    Talks on phone: Not on file    Gets together: Not on file    Attends religious service: Not on file    Active member of club or organization: Not on file    Attends meetings of clubs or organizations: Not on file    Relationship status: Not on file  . Intimate partner violence    Fear of current or ex partner: Not on file    Emotionally abused: Not on file    Physically abused: Not on file    Forced sexual activity: Not on file  Other Topics Concern  . Not on file  Social History Narrative  . Not on file    ROS  General:  Negative for nexplained weight loss, fever Skin: Negative for new or changing mole, sore that won't heal HEENT: Negative  for trouble hearing, trouble seeing, ringing in ears, mouth sores, hoarseness, change in voice, dysphagia. CV: Positive for palpitations, negative for chest pain, dyspnea, edema Resp: Negative for cough, dyspnea, hemoptysis GI: Negative for nausea, vomiting, diarrhea, constipation, abdominal pain, melena, hematochezia. GU: Negative for dysuria, incontinence, urinary hesitance, hematuria, vaginal or penile discharge, polyuria, sexual difficulty, lumps in testicle or breasts MSK: Negative for muscle cramps or aches, joint pain or swelling Neuro: Positive for headaches, negative for weakness, numbness, dizziness, passing out/fainting Psych: Negative for depression, anxiety, memory problems  Objective  Physical Exam Vitals:   09/17/18 0940  BP: 120/80  Pulse: 94  Temp: 98.8 F (37.1 C)  SpO2: 97%    BP Readings from Last 3 Encounters:  09/17/18 120/80  09/16/18 111/73  09/16/18 117/75   Wt Readings from Last 3 Encounters:  09/17/18 161 lb 1.9 oz (73.1 kg)  09/16/18 162 lb 14.4 oz (73.9 kg)  03/08/18 165 lb 11.2 oz (75.2 kg)    Physical Exam Constitutional:      General: He is not in acute distress.    Appearance: He is not diaphoretic.  HENT:     Head: Normocephalic and atraumatic.  Eyes:     Conjunctiva/sclera: Conjunctivae normal.     Pupils: Pupils are equal, round, and reactive to light.  Cardiovascular:     Rate and Rhythm: Normal rate and regular rhythm.     Heart sounds: Normal heart sounds.  Pulmonary:     Effort: Pulmonary effort is normal.     Breath sounds: Normal breath sounds.  Abdominal:     General: Bowel sounds are normal. There is no distension.     Palpations: Abdomen is soft.     Tenderness: There is no abdominal tenderness. There is no guarding or rebound.  Musculoskeletal:     Right lower leg: No edema.     Left lower leg: No edema.  Lymphadenopathy:     Cervical: No cervical adenopathy.  Skin:    General: Skin is warm and dry.  Neurological:      Mental Status: He is alert.     Comments: Moves all extremities equally with no difficulty  Psychiatric:        Mood and Affect: Mood normal.     EKG: Normal sinus rhythm, rate 71, anterolateral ST elevation repolarization variant, left atrial enlargement   Assessment/Plan:   Encounter for general adult medical examination with abnormal findings Physical exam completed.  Encouraged to increase activity level.  He   will continue healthy diet.  Encouraged to see ophthalmology.  Lab work as outlined below.  Headache I suspect this is related to allergic rhinitis issues.  Encouraged him to use Claritin daily for this.  Palpitations Occurring more frequently.  EKG is reassuring.  Will check labs and then likely refer to cardiology if no obvious cause on lab work.  Memory difficulty Mini cog testing 5/5.  Discussed that it is not uncommon to have difficulty remembering names.  He will monitor for any changes.   Orders Placed This Encounter  Procedures  . Comp Met (CMET)  . Lipid panel  . TSH  . EKG 12-Lead    No orders of the defined types were placed in this encounter.    [name], MD [place]

## 2018-09-22 NOTE — Assessment & Plan Note (Signed)
Occurring more frequently.  EKG is reassuring.  Will check labs and then likely refer to cardiology if no obvious cause on lab work.

## 2018-09-22 NOTE — Assessment & Plan Note (Signed)
Physical exam completed.  Encouraged to increase activity level.  He will continue healthy diet.  Encouraged to see ophthalmology.  Lab work as outlined below.

## 2018-09-22 NOTE — Assessment & Plan Note (Signed)
Mini cog testing 5/5.  Discussed that it is not uncommon to have difficulty remembering names.  He will monitor for any changes.

## 2018-09-22 NOTE — Assessment & Plan Note (Signed)
I suspect this is related to allergic rhinitis issues.  Encouraged him to use Claritin daily for this.

## 2018-11-01 ENCOUNTER — Ambulatory Visit (INDEPENDENT_AMBULATORY_CARE_PROVIDER_SITE_OTHER): Payer: BC Managed Care – PPO | Admitting: Cardiovascular Disease

## 2018-11-01 ENCOUNTER — Encounter: Payer: Self-pay | Admitting: Cardiovascular Disease

## 2018-11-01 ENCOUNTER — Other Ambulatory Visit: Payer: Self-pay

## 2018-11-01 VITALS — BP 110/74 | HR 77 | Ht 72.0 in | Wt 159.5 lb

## 2018-11-01 DIAGNOSIS — I493 Ventricular premature depolarization: Secondary | ICD-10-CM | POA: Diagnosis not present

## 2018-11-01 NOTE — Patient Instructions (Signed)
Medication Instructions:  Your physician recommends that you continue on your current medications as directed. Please refer to the Current Medication list given to you today.  If you need a refill on your cardiac medications before your next appointment, please call your pharmacy.   Lab work: None ordered If you have labs (blood work) drawn today and your tests are completely normal, you will receive your results only by: Marland Kitchen MyChart Message (if you have MyChart) OR . A paper copy in the mail If you have any lab test that is abnormal or we need to change your treatment, we will call you to review the results.  Testing/Procedures: Your physician has requested that you have an echocardiogram. Echocardiography is a painless test that uses sound waves to create images of your heart. It provides your doctor with information about the size and shape of your heart and how well your heart's chambers and valves are working. This procedure takes approximately one hour. There are no restrictions for this procedure.    Follow-Up: At [facility], you and your health needs are our priority.  As part of our continuing mission to provide you with exceptional heart care, we have created designated Provider Care Teams.  These Care Teams include your primary Cardiologist (physician) and Advanced Practice Providers (APPs -  Physician Assistants and Nurse Practitioners) who all work together to provide you with the care you need, when you need it. You will need a follow up appointment as needed  You may see  Dr. Fletcher Anon or one of the following Advanced Practice Providers on your designated Care Team:   Murray Hodgkins, NP Christell Faith, PA-C . Marrianne Mood, PA-C  Any Other Special Instructions Will Be Listed Below (If Applicable). N/A

## 2018-11-01 NOTE — Progress Notes (Signed)
Cardiology Office Note   Date:  11/01/2018   ID:  Khuong Criscione, DOB 04-03-1975, MRN [id number]  PCP:  [name], MD  Cardiologist:   Kathlyn Sacramento, MD   Chief Complaint  Patient presents with  . New Patient (Initial Visit)    Referred by PCP for palpitations more common.. Meds reviewed verbally with patient.       History of Present Illness: Cole Lane is a 43 y.o. male who was referred by Dr. Caryl Bis for evaluation of palpitations. He reports prolonged history of palpitations over the last 10 to 15 years.  This is described as a pause in his heart followed by a strong beat.  He has no chest pain, shortness of breath, dizziness, syncope or presyncope.  The symptoms got worse in June and July but then they resolved.  He has not had palpitations in the last month.  He has been healthy throughout his life but does has history of familial hemochromatosis and undergoes phlebotomy twice a year.  He is a lifelong non-smoker and does not drink alcohol or caffeinated products.  He is a Gaffer.  Family history is negative for premature coronary artery disease or sudden death.    He had recent labs done which were unremarkable including CMP and TSH.  Past Medical History:  Diagnosis Date  . Allergic rhinitis   . Allergy   . Asthma   . Kidney stone   . Palpitation     Past Surgical History:  Procedure Laterality Date  . DERMOID CYST  EXCISION       Current Outpatient Medications  Medication Sig Dispense Refill  . ibuprofen (ADVIL,MOTRIN) 200 MG tablet Take 200 mg by mouth every 6 (six) hours as needed.    . loratadine (CLARITIN) 10 MG tablet Take 10 mg by mouth daily.     No current facility-administered medications for this visit.     Allergies:   Penicillin g    Social History:  The patient  reports that he has never smoked. He has never used smokeless tobacco. He reports that he does not drink alcohol or use drugs.   Family History:   The patient's family history includes Arthritis in an other family member; Asthma in his maternal aunt; Hypertension in his father and another family member; Mental illness in an other family member; Stroke in an other family member.    ROS:  Please see the history of present illness.   Otherwise, review of systems are positive for none.   All other systems are reviewed and negative.    PHYSICAL EXAM: VS:  BP 110/74 (BP Location: Right Arm, Patient Position: Sitting, Cuff Size: Normal)   Pulse 77   Ht 6' (1.829 m)   Wt 159 lb 8 oz (72.3 kg)   BMI 21.63 kg/m  , BMI Body mass index is 21.63 kg/m. GEN: Well nourished, well developed, in no acute distress  HEENT: normal  Neck: no JVD, carotid bruits, or masses Cardiac: RRR; no murmurs, rubs, or gallops,no edema  Respiratory:  clear to auscultation bilaterally, normal work of breathing GI: soft, nontender, nondistended, + BS MS: no deformity or atrophy  Skin: warm and dry, no rash Neuro:  Strength and sensation are intact Psych: euthymic mood, full affect   EKG:  EKG is ordered today. The ekg ordered today demonstrates normal sinus rhythm with possible left atrial enlargement.  No significant ST or T wave changes.  Normal PR and QT intervals.   Recent  Labs: 09/13/2018: Hemoglobin 13.7; Platelets 195 09/17/2018: ALT 23; BUN 11; Creatinine, Ser 0.91; Potassium 4.2; Sodium 139; TSH 1.35    Lipid Panel    Component Value Date/Time   CHOL 161 09/17/2018 1025   TRIG 65.0 09/17/2018 1025   HDL 42.10 09/17/2018 1025   CHOLHDL 4 09/17/2018 1025   VLDL 13.0 09/17/2018 1025   LDLCALC 105 (H) 09/17/2018 1025      Wt Readings from Last 3 Encounters:  11/01/18 159 lb 8 oz (72.3 kg)  09/17/18 161 lb 1.9 oz (73.1 kg)  09/16/18 162 lb 14.4 oz (73.9 kg)        PAD Screen 11/01/2018  Previous PAD dx? No  Previous surgical procedure? No  Pain with walking? No  Feet/toe relief with dangling? No  Painful, non-healing ulcers? No   Extremities discolored? No      ASSESSMENT AND PLAN:  1.  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  However, the burden of PVCs seems to be low.  He has not had much symptoms over the last month.  Given history of hemochromatosis, I am going to obtain an echocardiogram to ensure no underlying structural heart abnormalities.  If echocardiogram is unremarkable, no further work-up of this is needed and given that the burden is overall low, no treatment is needed. There is very little diagnostic utility for outpatient monitor at the present time.   Disposition:   FU with me as needed.   Signed,  Kathlyn Sacramento, MD  11/01/2018 4:22 PM    [place]

## 2018-11-29 ENCOUNTER — Other Ambulatory Visit: Payer: Self-pay

## 2018-11-29 ENCOUNTER — Ambulatory Visit (INDEPENDENT_AMBULATORY_CARE_PROVIDER_SITE_OTHER): Payer: BC Managed Care – PPO

## 2018-11-29 DIAGNOSIS — I493 Ventricular premature depolarization: Secondary | ICD-10-CM

## 2019-01-10 ENCOUNTER — Ambulatory Visit
Admission: RE | Admit: 2019-01-10 | Discharge: 2019-01-10 | Disposition: A | Discharge status: Home / Self Care | Payer: BC Managed Care – PPO | Source: Home / Self Care | Attending: *Deleted | Admitting: *Deleted

## 2019-01-10 ENCOUNTER — Ambulatory Visit
Admission: EM | Admit: 2019-01-10 | Discharge: 2019-01-10 | Disposition: A | Discharge status: Home / Self Care | Payer: BC Managed Care – PPO | Attending: Emergency Medicine | Admitting: Emergency Medicine

## 2019-01-10 ENCOUNTER — Ambulatory Visit
Admission: RE | Admit: 2019-01-10 | Discharge: 2019-01-10 | Disposition: A | Discharge status: Home / Self Care | Payer: BC Managed Care – PPO | Attending: Emergency Medicine | Admitting: Emergency Medicine

## 2019-01-10 ENCOUNTER — Encounter: Payer: Self-pay | Admitting: Emergency Medicine

## 2019-01-10 ENCOUNTER — Other Ambulatory Visit: Payer: Self-pay

## 2019-01-10 ENCOUNTER — Telehealth: Payer: Self-pay | Admitting: Family Medicine

## 2019-01-10 DIAGNOSIS — M79631 Pain in right forearm: Secondary | ICD-10-CM

## 2019-01-10 DIAGNOSIS — S59911A Unspecified injury of right forearm, initial encounter: Secondary | ICD-10-CM | POA: Diagnosis not present

## 2019-01-10 HISTORY — DX: Hemochromatosis, unspecified: E83.119

## 2019-01-10 MED ORDER — IBUPROFEN 800 MG PO TABS: 800.0000 mg | ORAL_TABLET | Freq: Three times a day (TID) | ORAL | 0 refills | Status: DC | PRN
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
## 2019-01-10 NOTE — Telephone Encounter (Signed)
I called the patient and informed him that there are no appointments available and he should go to an urgent care.  He asked me which one and I informed him about the [facility] across the street from our building.  Pt understood.  Jyrah Blye,cma

## 2019-01-10 NOTE — Discharge Instructions (Addendum)
Go to [facility] for your x-ray.  We will call you with results later today.  Take the ibuprofen as prescribed.  Rest and elevate your arm.  Apply ice packs 2-3 times a day for up to 20 minutes each.  Wear the wrist splint as needed for comfort.    Follow up with an orthopedist if you symptoms continue or worsen;  Or if you develop new symptoms, such as numbness, tingling, or weakness.
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
## 2019-03-17 ENCOUNTER — Other Ambulatory Visit: Payer: Self-pay

## 2019-03-17 ENCOUNTER — Inpatient Hospital Stay: Payer: BC Managed Care – PPO | Attending: Oncology

## 2019-03-17 DIAGNOSIS — K760 Fatty (change of) liver, not elsewhere classified: Secondary | ICD-10-CM | POA: Diagnosis not present

## 2019-03-17 DIAGNOSIS — Z8249 Family history of ischemic heart disease and other diseases of the circulatory system: Secondary | ICD-10-CM | POA: Insufficient documentation

## 2019-03-17 DIAGNOSIS — R7401 Elevation of levels of liver transaminase levels: Secondary | ICD-10-CM

## 2019-03-17 DIAGNOSIS — Z791 Long term (current) use of non-steroidal anti-inflammatories (NSAID): Secondary | ICD-10-CM | POA: Diagnosis not present

## 2019-03-17 DIAGNOSIS — Z8261 Family history of arthritis: Secondary | ICD-10-CM | POA: Insufficient documentation

## 2019-03-17 LAB — CBC WITH DIFFERENTIAL/PLATELET
Abs Immature Granulocytes: 0.01 10*3/uL (ref 0.00–0.07)
Basophils Absolute: 0.1 10*3/uL (ref 0.0–0.1)
Basophils Relative: 1 %
Eosinophils Absolute: 0.1 10*3/uL (ref 0.0–0.5)
Eosinophils Relative: 2 %
HCT: 43 % (ref 39.0–52.0)
Hemoglobin: 13.8 g/dL (ref 13.0–17.0)
Immature Granulocytes: 0 %
Lymphocytes Relative: 29 %
Lymphs Abs: 1.5 10*3/uL (ref 0.7–4.0)
MCH: 30.1 pg (ref 26.0–34.0)
MCHC: 32.1 g/dL (ref 30.0–36.0)
MCV: 93.7 fL (ref 80.0–100.0)
Monocytes Absolute: 0.4 10*3/uL (ref 0.1–1.0)
Monocytes Relative: 8 %
Neutro Abs: 3 10*3/uL (ref 1.7–7.7)
Neutrophils Relative %: 60 %
Platelets: 204 10*3/uL (ref 150–400)
RBC: 4.59 MIL/uL (ref 4.22–5.81)
RDW: 11.8 % (ref 11.5–15.5)
WBC: 5 10*3/uL (ref 4.0–10.5)
nRBC: 0 % (ref 0.0–0.2)

## 2019-03-17 LAB — IRON AND TIBC
Iron: 65 ug/dL (ref 45–182)
Saturation Ratios: 21 % (ref 17.9–39.5)
TIBC: 308 ug/dL (ref 250–450)
UIBC: 243 ug/dL

## 2019-03-17 LAB — HEPATIC FUNCTION PANEL
ALT: 25 U/L (ref 0–44)
AST: 45 U/L — ABNORMAL HIGH (ref 15–41)
Albumin: 4.6 g/dL (ref 3.5–5.0)
Alkaline Phosphatase: 71 U/L (ref 38–126)
Bilirubin, Direct: <0.1 mg/dL (ref 0.0–0.2)
Total Bilirubin: 0.7 mg/dL (ref 0.3–1.2)
Total Protein: 7.9 g/dL (ref 6.5–8.1)

## 2019-03-17 LAB — FERRITIN: Ferritin: 55 ng/mL (ref 24–336)

## 2019-03-18 ENCOUNTER — Other Ambulatory Visit: Payer: Self-pay

## 2019-03-18 NOTE — Progress Notes (Signed)
Patient pre screened for office appointment, no questions or concerns today. Patient reminded of upcoming appointment time and date. 

## 2019-03-19 ENCOUNTER — Inpatient Hospital Stay (HOSPITAL_BASED_OUTPATIENT_CLINIC_OR_DEPARTMENT_OTHER): Payer: BC Managed Care – PPO | Admitting: Oncology

## 2019-03-19 ENCOUNTER — Encounter: Payer: Self-pay | Admitting: Oncology

## 2019-03-19 ENCOUNTER — Inpatient Hospital Stay: Payer: BC Managed Care – PPO

## 2019-03-19 ENCOUNTER — Other Ambulatory Visit: Payer: BC Managed Care – PPO

## 2019-03-19 ENCOUNTER — Other Ambulatory Visit: Payer: Self-pay

## 2019-03-19 DIAGNOSIS — Z791 Long term (current) use of non-steroidal anti-inflammatories (NSAID): Secondary | ICD-10-CM | POA: Diagnosis not present

## 2019-03-19 DIAGNOSIS — K760 Fatty (change of) liver, not elsewhere classified: Secondary | ICD-10-CM | POA: Diagnosis not present

## 2019-03-19 DIAGNOSIS — R7401 Elevation of levels of liver transaminase levels: Secondary | ICD-10-CM | POA: Diagnosis not present

## 2019-03-19 DIAGNOSIS — Z8261 Family history of arthritis: Secondary | ICD-10-CM | POA: Diagnosis not present

## 2019-03-19 DIAGNOSIS — Z8249 Family history of ischemic heart disease and other diseases of the circulatory system: Secondary | ICD-10-CM | POA: Diagnosis not present

## 2019-03-19 NOTE — Progress Notes (Signed)
Patient does not offer any problems today.  

## 2019-03-20 NOTE — Progress Notes (Signed)
Hematology/Oncology Follow Up Visit [facility] [phone] [phone]   Patient Care Team: [name], MD as PCP - General (Family Medicine)  Referring Physician Dr.[email or web address].Eric  REASON FOR VISIT Follow up for treatment of hemochromatosis and iron overload  PERTINENT HEMATOLOGY HISTORY Cole Lane 44 y.o.  male with past medical history listed as per low who was referred by Dr. 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 Patient denies any family history of hemochromatosis. He is eating history his health state. He feels well at baseline except sometimes if he stands up pretty fast for dizziness. He has been following up with primary care physician and has been noticed that his liver function tests has been mildly elevated. Additional workup showed high ferritin 353, also high transferrin saturation of 70%. 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 He is married and has no children yet.  He follows up with GI Dr.Anna and has ongoing work up. US abdomen was suggested and has not been done yet.  # Discussed about avoiding alcohol, and uncooked seafood.  #Discussed about screening first degree relative for hereditary hemochromatosis.   INTERVAL HISTORY Cole Lane is a 45 y.o. 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 Patient reports feeling well since last visit. No new complaints .  Marland Kitchen   ROS:  Review of Systems  Constitutional: Negative for appetite change, chills, diaphoresis, fatigue, fever and unexpected weight change.  HENT:   Negative for hearing loss, lump/mass, nosebleeds, sore throat and voice change.   Eyes: Negative for eye problems and icterus.  Respiratory: Negative for chest tightness, cough,  hemoptysis, shortness of breath and wheezing.   Cardiovascular: Negative for chest pain and leg swelling.  Gastrointestinal: Negative for abdominal distention, abdominal pain, blood in stool, diarrhea, nausea and rectal pain.  Endocrine: Negative for hot flashes.  Genitourinary: Negative for bladder incontinence, difficulty urinating, dysuria, frequency, hematuria and nocturia.   Musculoskeletal: Negative for arthralgias, back pain, flank pain, gait problem and myalgias.  Skin: Negative for itching and rash.  Neurological: Negative for dizziness, gait problem, headaches, light-headedness, numbness and seizures.  Hematological: Negative for adenopathy. Does not bruise/bleed easily.  Psychiatric/Behavioral: Negative for confusion and decreased concentration. The patient is not nervous/anxious.     MEDICAL HISTORY:  Past Medical History:  Diagnosis Date  . Allergic rhinitis   . Allergy   . Asthma   . Hemochromatosis   . Kidney stone   . Palpitation     SURGICAL HISTORY: Past Surgical History:  Procedure Laterality Date  . DERMOID CYST  EXCISION      SOCIAL HISTORY: Social History   Socioeconomic History  . Marital status: Single    Spouse name: Not on file  . Number of children: Not on file  . Years of education: Not on file  . Highest education level: Not on file  Occupational History  . Not on file  Tobacco Use  . Smoking status: Never Smoker  . Smokeless tobacco: Never Used  Substance and Sexual Activity  . Alcohol use: No  . Drug use: No  . Sexual activity: Yes  Other Topics Concern  . Not on file  Social History Narrative  . Not on file   Social Determinants of Health   Financial Resource Strain:   . Difficulty of Paying Living Expenses: Not on file  Food Insecurity:   .  Worried About Charity fundraiser in the Last Year: Not on file  . Ran Out of Food in the Last Year: Not on file  Transportation Needs:   . Lack of Transportation (Medical): Not on file    . Lack of Transportation (Non-Medical): Not on file  Physical Activity:   . Days of Exercise per Week: Not on file  . Minutes of Exercise per Session: Not on file  Stress:   . Feeling of Stress : Not on file  Social Connections:   . Frequency of Communication with Friends and Family: Not on file  . Frequency of Social Gatherings with Friends and Family: Not on file  . Attends Religious Services: Not on file  . Active Member of Clubs or Organizations: Not on file  . Attends Archivist Meetings: Not on file  . Marital Status: Not on file  Intimate Partner Violence:   . Fear of Current or Ex-Partner: Not on file  . Emotionally Abused: Not on file  . Physically Abused: Not on file  . Sexually Abused: Not on file    FAMILY HISTORY: Family History  Problem Relation Age of Onset  . Asthma Maternal Aunt   . Hypertension Other        Parent  . Stroke Other        Grandparent  . Mental illness Other        Grandparent  . Arthritis Other        Grandparent  . Hypertension Father     ALLERGIES:  is allergic to penicillin g.  MEDICATIONS:  Current Outpatient Medications  Medication Sig Dispense Refill  . ibuprofen (ADVIL) 800 MG tablet Take 1 tablet (800 mg total) by mouth every 8 (eight) hours as needed. 21 tablet 0  . loratadine (CLARITIN) 10 MG tablet Take 10 mg by mouth daily.     No current facility-administered medications for this visit.      Marland Kitchen  PHYSICAL EXAMINATION: ECOG PERFORMANCE STATUS: 0 - Asymptomatic Vitals:   03/19/19 1312  BP: 125/81  Pulse: 64  Resp: 16  Temp: 97.6 F (36.4 C)   Filed Weights   03/19/19 1312  Weight: 159 lb 12.8 oz (72.5 kg)   Physical Exam  Constitutional: He is oriented to person, place, and time and well-developed, well-nourished, and in no distress. No distress.  HENT:  Head: Normocephalic and atraumatic.  Nose: Nose normal.  Mouth/Throat: Oropharynx is clear and moist. No oropharyngeal exudate.  Eyes: Pupils  are equal, round, and reactive to light. EOM are normal. Left eye exhibits no discharge. No scleral icterus.  Neck: No JVD present.  Cardiovascular: Normal rate, regular rhythm and normal heart sounds.  No murmur heard. Pulmonary/Chest: Effort normal and breath sounds normal. No respiratory distress. He has no wheezes. He has no rales. He exhibits no tenderness.  Abdominal: Soft. Bowel sounds are normal. He exhibits no distension and no mass. There is no abdominal tenderness. There is no rebound.  Musculoskeletal:        General: No tenderness or edema. Normal range of motion.     Cervical back: Normal range of motion and neck supple.  Lymphadenopathy:    He has no cervical adenopathy.  Neurological: He is alert and oriented to person, place, and time. No cranial nerve deficit. He exhibits normal muscle tone. Coordination normal.  Skin: Skin is warm and dry. No rash noted. He is not diaphoretic. No erythema.  Psychiatric: Affect and judgment normal.   LABORATORY DATA:  I have reviewed the data as listed Lab Results  Component Value Date   WBC 5.0 03/17/2019   HGB 13.8 03/17/2019   HCT 43.0 03/17/2019   MCV 93.7 03/17/2019   PLT 204 03/17/2019   Recent Labs    09/17/18 1025 03/17/19 1059  NA 139  --   K 4.2  --   CL 103  --   CO2 29  --   GLUCOSE 91  --   BUN 11  --   CREATININE 0.91  --   CALCIUM 9.7  --   PROT 7.5 7.9  ALBUMIN 4.8 4.6  AST 36 45*  ALT 23 25  ALKPHOS 61 71  BILITOT 0.9 0.7  BILIDIR  --  <0.1  IBILI  --  NOT CALCULATED   Iron/TIBC/Ferritin/ %Sat    Component Value Date/Time   IRON 65 03/17/2019 1059   TIBC 308 03/17/2019 1059   FERRITIN 55 03/17/2019 1059   IRONPCTSAT 21 03/17/2019 1059   IRONPCTSAT 70 (H) 09/26/2016 1115   Hepatitis Panel all negative.  DNA Mutation Analysis              RESULT: COMPOUND HETEROZYGOUS FOR THE C282Y AND H63D MUTATIONS   RADIOGRAPHIC STUDIES: I have personally reviewed the radiological images as  listed and agreed with the findings in the report. 02/08/2017 abdomen ultrasoundshowed no focal lesion in the liver.  Within normal limits in parenchymal echogenicity.  Right renal cyst.      ASSESSMENT & PLAN:  1. Hereditary hemochromatosis ([place])   2. 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 Ferritin has slightly increased to 56. Iron saturation 21. Hold phlebotomy.  Ferritin less than 500 lower at low risk for developing hemochromatosis related signs and symptoms.  Transaminitis, chronic mild elevate due to NAFLD. Continue to follow up with gastroenterologist.  All questions were answered. The patient knows to call the clinic with any problems questions or concerns. Orders Placed This Encounter  Procedures  . CBC with Differential/Platelet    Standing Status:   Future    Standing Expiration Date:   03/18/2020  . Ferritin    Standing Status:   Future    Standing Expiration Date:   03/18/2020  . Iron and TIBC    Standing Status:   Future    Standing Expiration Date:   03/18/2020  . Hepatic function panel    Standing Status:   Future    Standing Expiration Date:   03/18/2020    Return of visit: CBC,  iron TIBC ferritin in 6 months, MD and in 6 months.   Earlie Server, MD, PhD Hematology Oncology [place] [facility] 03/20/2019

## 2019-05-22 ENCOUNTER — Ambulatory Visit: Payer: BC Managed Care – PPO | Attending: Internal Medicine

## 2019-05-22 DIAGNOSIS — Z23 Encounter for immunization: Secondary | ICD-10-CM

## 2019-05-22 NOTE — Progress Notes (Signed)
   Covid-19 Vaccination Clinic  Name:  Cole Lane    MRN: [id number] DOB: 11/30/1975  05/22/2019  Mr. Topp was observed post Covid-19 immunization for 15 minutes without incident. He was provided with Vaccine Information Sheet and instruction to access the V-Safe system.   Mr. Garbo was instructed to call 911 with any severe reactions post vaccine: Marland Kitchen Difficulty breathing  . Swelling of face and throat  . A fast heartbeat  . A bad rash all over body  . Dizziness and weakness   Immunizations Administered    Name Date Dose VIS Date Route   Pfizer COVID-19 Vaccine 05/22/2019 10:40 AM 0.3 mL 01/31/2019 Intramuscular   Manufacturer: [employer]   Lot: [id number]   [place]: [id number]

## 2019-06-16 ENCOUNTER — Ambulatory Visit: Payer: BC Managed Care – PPO | Attending: Internal Medicine

## 2019-06-16 DIAGNOSIS — Z23 Encounter for immunization: Secondary | ICD-10-CM

## 2019-06-16 NOTE — Progress Notes (Signed)
   Covid-19 Vaccination Clinic  Name:  Cole Lane    MRN: [id number] DOB: [birth year]  06/16/2019  [name] was observed post Covid-19 immunization for 15 minutes without incident. He was provided with Vaccine Information Sheet and instruction to access the V-Safe system.   [name] was instructed to call 911 with any severe reactions post vaccine: Marland Kitchen Difficulty breathing  . Swelling of face and throat  . A fast heartbeat  . A bad rash all over body  . Dizziness and weakness   Immunizations Administered    Name Date Dose VIS Date Route   Pfizer COVID-19 Vaccine 06/16/2019  8:59 AM 0.3 mL 04/16/2018 Intramuscular   Manufacturer: [place]   Lot: [id number]   [place]: [id number]

## 2019-07-13 IMAGING — US US ABDOMEN COMPLETE
1 series · 14 of 25 positions shown · non-contrast
Comparison: 04/19/2010

CLINICAL DATA: Hemochromatosis

EXAM:
ABDOMEN ULTRASOUND COMPLETE

[Series 1: us abdomen complete · 0.17mm/px · 14 of 129 slices shown]
[im 1/129]
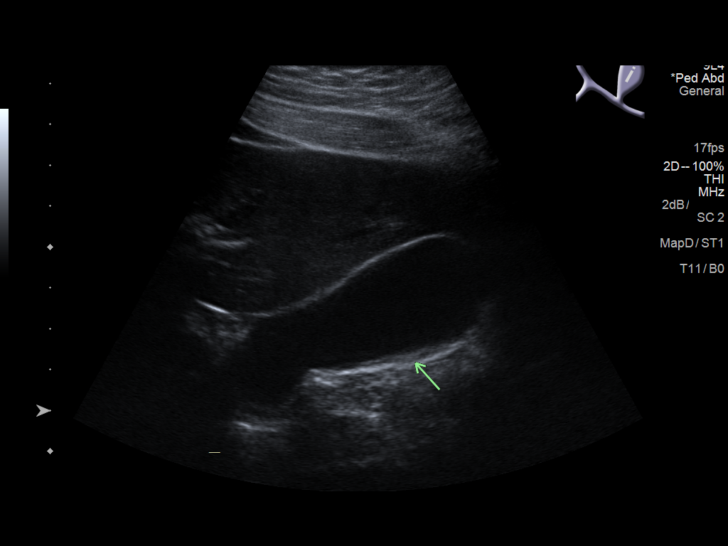
[im 11/129]
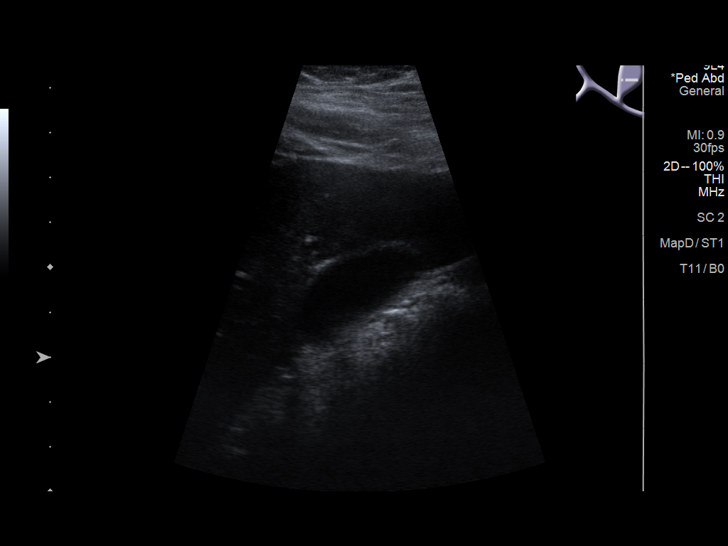
[im 22/129]
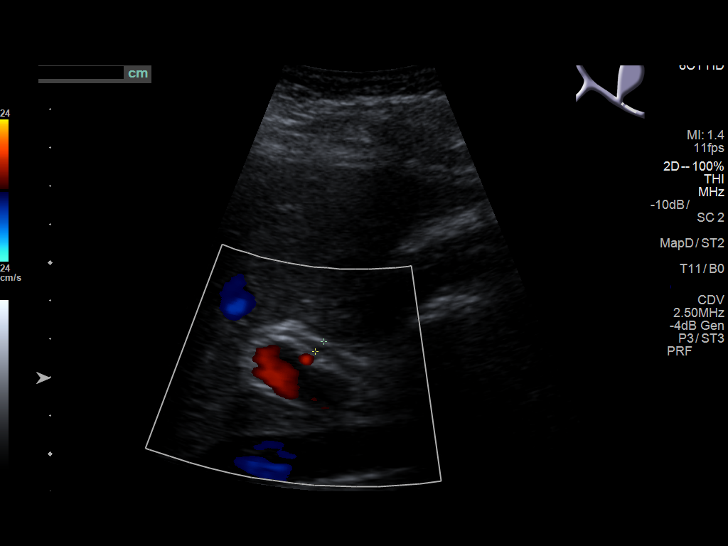
[im 33/129]
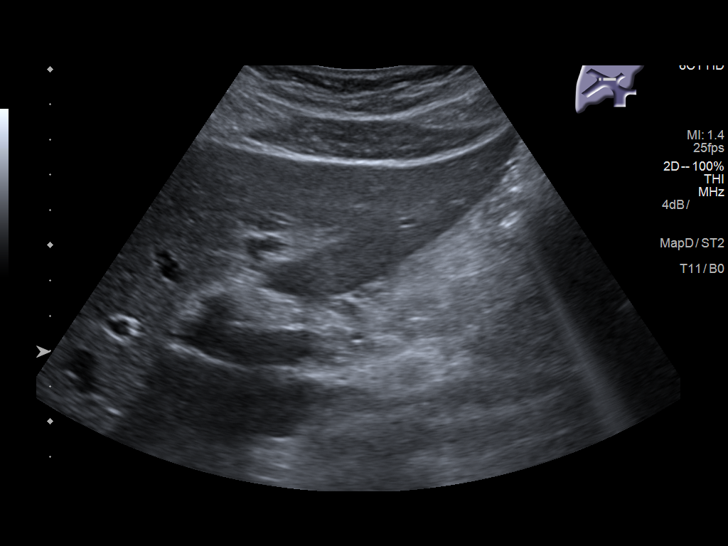
[im 43/129]
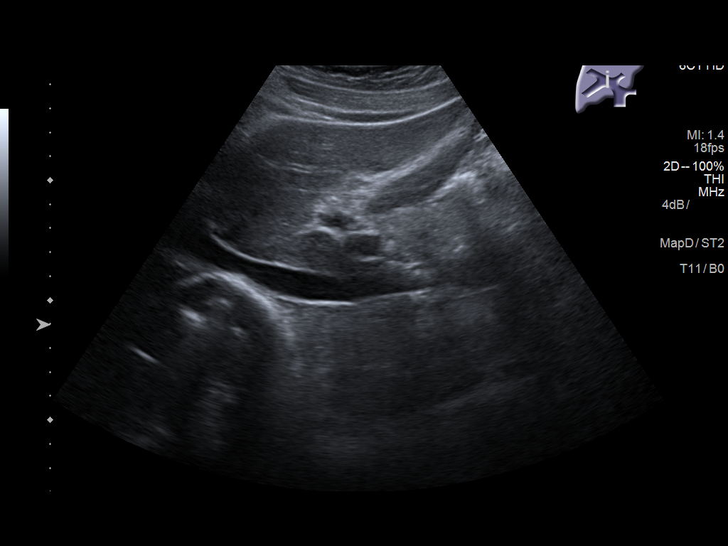
[im 49/129]
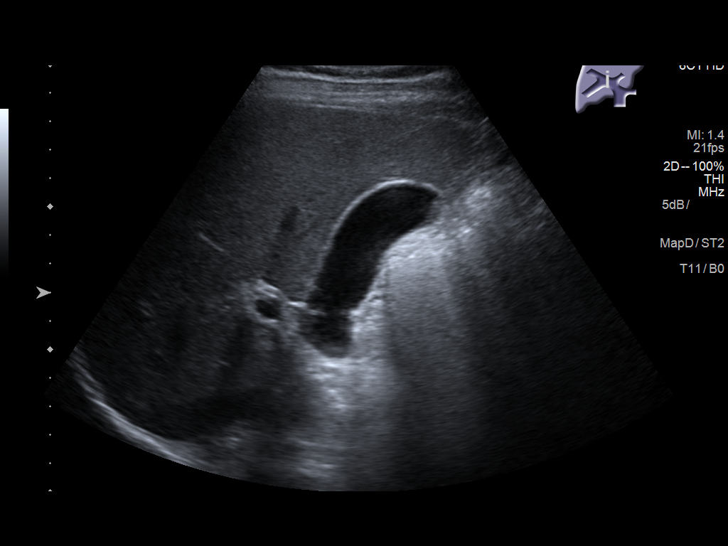
[im 59/129]
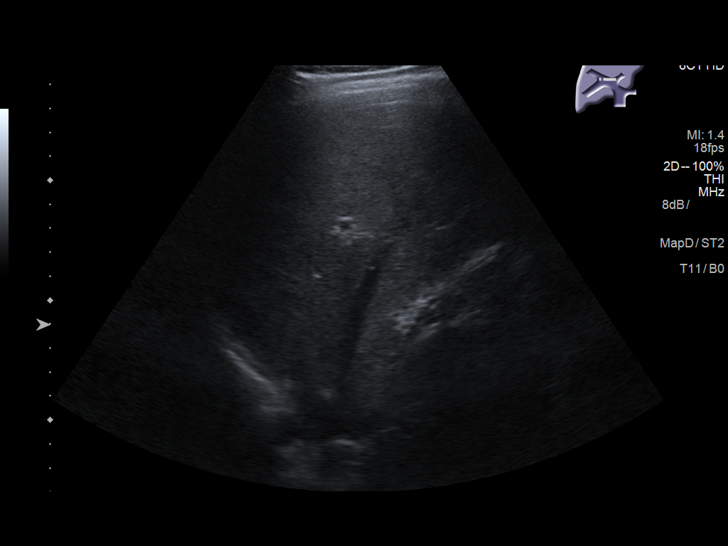
[im 70/129]
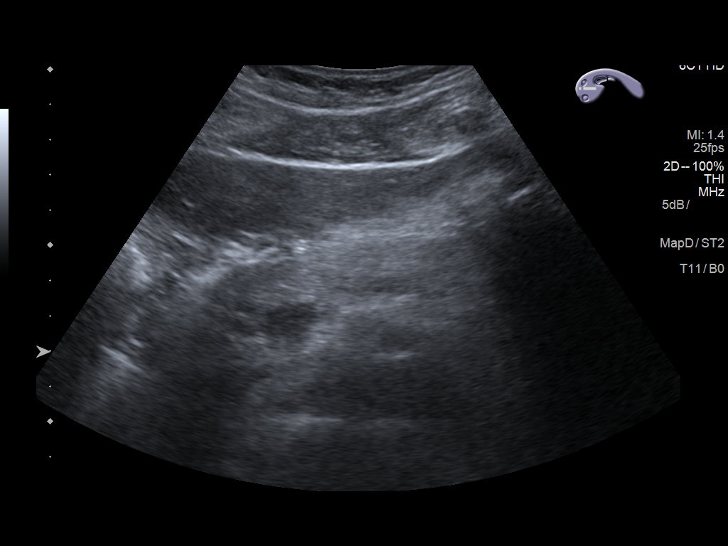
[im 81/129]
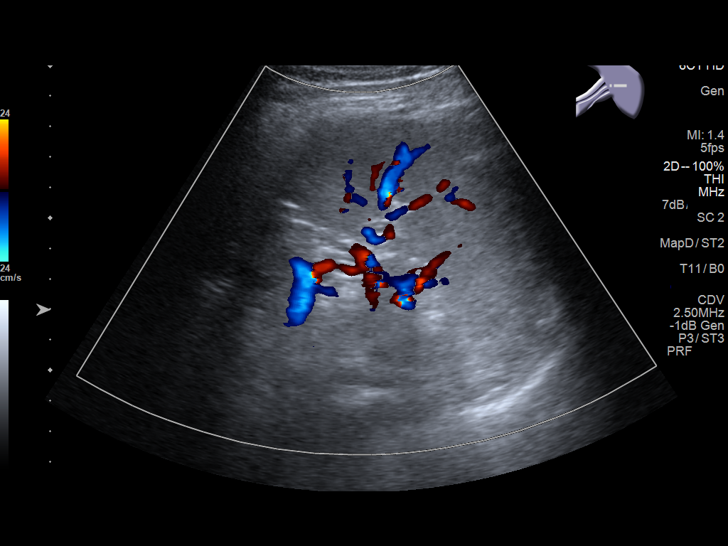
[im 86/129]
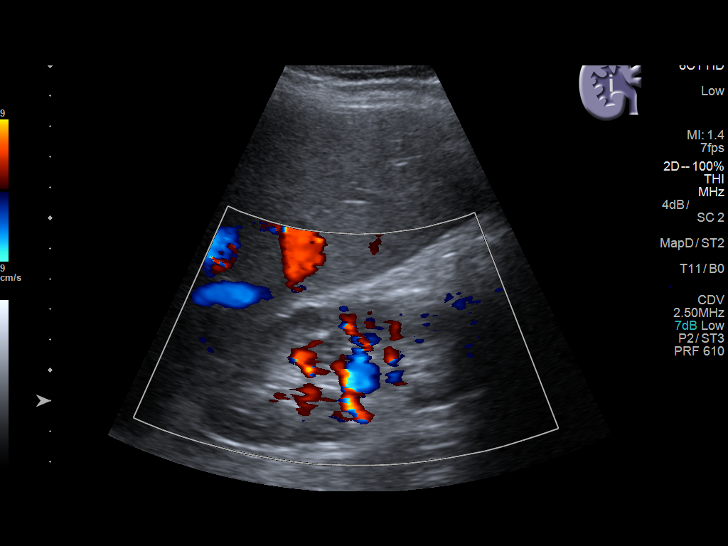
[im 97/129]
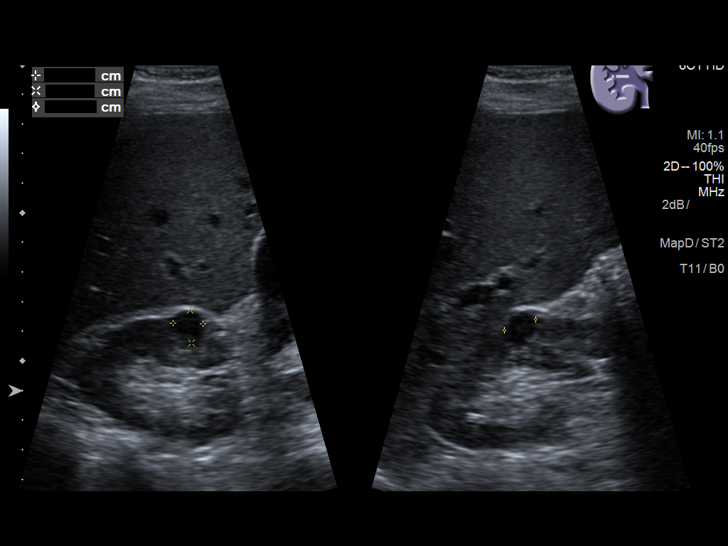
[im 107/129]
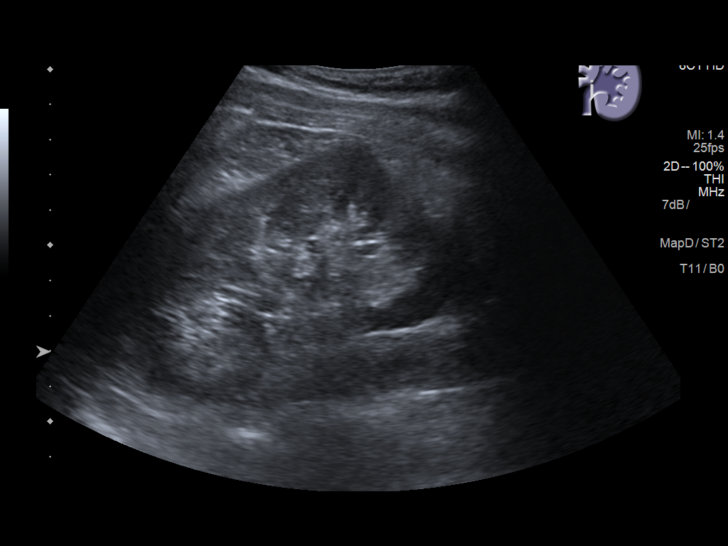
[im 118/129]
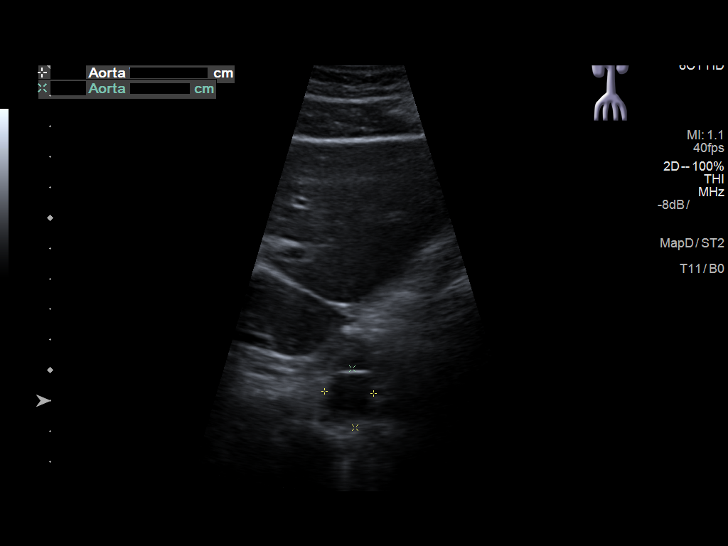
[im 129/129]
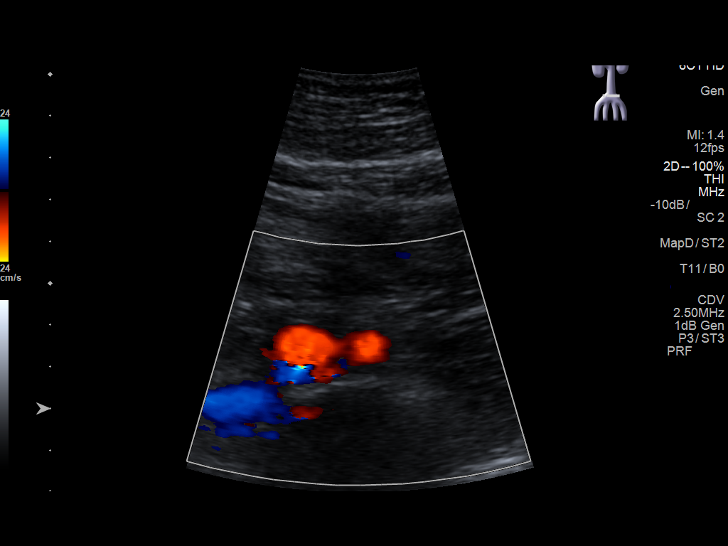

[14 of 25 positions shown; findings below may reference images not displayed]

FINDINGS: Gallbladder: Gallbladder is well distended with a minimal
gallbladder sludge. No cholelithiasis is noted. No pericholecystic
fluid is seen.

Common bile duct: Diameter: 3.4 mm.

Liver: No focal lesion identified. Within normal limits in
parenchymal echogenicity. Portal vein is patent on color Doppler
imaging with normal direction of blood flow towards the liver.

IVC: No abnormality visualized.

Pancreas: Visualized portion unremarkable.

Spleen: Size and appearance within normal limits.

Right Kidney: Length: 10.1 cm.. No hydronephrotic changes are seen.
A a mid upper pole renal cyst is noted measuring 1.5 cm.

Left Kidney: Length: 10.9 cm.. Echogenicity within normal limits. No
mass or hydronephrosis visualized.

Abdominal aorta: No aneurysm visualized.

Other findings: None.
IMPRESSION: Right renal cyst.

Minimal gallbladder sludge.

No other focal abnormality is noted.

## 2019-09-15 ENCOUNTER — Other Ambulatory Visit: Payer: Self-pay

## 2019-09-15 ENCOUNTER — Inpatient Hospital Stay: Payer: BC Managed Care – PPO | Attending: Oncology

## 2019-09-15 DIAGNOSIS — Z79899 Other long term (current) drug therapy: Secondary | ICD-10-CM | POA: Insufficient documentation

## 2019-09-15 DIAGNOSIS — Z8261 Family history of arthritis: Secondary | ICD-10-CM | POA: Diagnosis not present

## 2019-09-15 DIAGNOSIS — Z880 Allergy status to penicillin: Secondary | ICD-10-CM | POA: Diagnosis not present

## 2019-09-15 DIAGNOSIS — Z8249 Family history of ischemic heart disease and other diseases of the circulatory system: Secondary | ICD-10-CM | POA: Diagnosis not present

## 2019-09-15 DIAGNOSIS — Z823 Family history of stroke: Secondary | ICD-10-CM | POA: Insufficient documentation

## 2019-09-15 DIAGNOSIS — Z818 Family history of other mental and behavioral disorders: Secondary | ICD-10-CM | POA: Insufficient documentation

## 2019-09-15 DIAGNOSIS — R7401 Elevation of levels of liver transaminase levels: Secondary | ICD-10-CM | POA: Insufficient documentation

## 2019-09-15 DIAGNOSIS — Z836 Family history of other diseases of the respiratory system: Secondary | ICD-10-CM | POA: Diagnosis not present

## 2019-09-15 DIAGNOSIS — R42 Dizziness and giddiness: Secondary | ICD-10-CM | POA: Insufficient documentation

## 2019-09-15 LAB — HEPATIC FUNCTION PANEL
ALT: 22 U/L (ref 0–44)
AST: 38 U/L (ref 15–41)
Albumin: 4.7 g/dL (ref 3.5–5.0)
Alkaline Phosphatase: 63 U/L (ref 38–126)
Bilirubin, Direct: 0.1 mg/dL (ref 0.0–0.2)
Indirect Bilirubin: 1.2 mg/dL — ABNORMAL HIGH (ref 0.3–0.9)
Total Bilirubin: 1.3 mg/dL — ABNORMAL HIGH (ref 0.3–1.2)
Total Protein: 7.9 g/dL (ref 6.5–8.1)

## 2019-09-15 LAB — CBC WITH DIFFERENTIAL/PLATELET
Abs Immature Granulocytes: 0.01 10*3/uL (ref 0.00–0.07)
Basophils Absolute: 0 10*3/uL (ref 0.0–0.1)
Basophils Relative: 1 %
Eosinophils Absolute: 0.1 10*3/uL (ref 0.0–0.5)
Eosinophils Relative: 2 %
HCT: 41.6 % (ref 39.0–52.0)
Hemoglobin: 14 g/dL (ref 13.0–17.0)
Immature Granulocytes: 0 %
Lymphocytes Relative: 32 %
Lymphs Abs: 1.7 10*3/uL (ref 0.7–4.0)
MCH: 30.5 pg (ref 26.0–34.0)
MCHC: 33.7 g/dL (ref 30.0–36.0)
MCV: 90.6 fL (ref 80.0–100.0)
Monocytes Absolute: 0.4 10*3/uL (ref 0.1–1.0)
Monocytes Relative: 8 %
Neutro Abs: 3 10*3/uL (ref 1.7–7.7)
Neutrophils Relative %: 57 %
Platelets: 181 10*3/uL (ref 150–400)
RBC: 4.59 MIL/uL (ref 4.22–5.81)
RDW: 11.5 % (ref 11.5–15.5)
WBC: 5.2 10*3/uL (ref 4.0–10.5)
nRBC: 0 % (ref 0.0–0.2)

## 2019-09-15 LAB — IRON AND TIBC
Iron: 138 ug/dL (ref 45–182)
Saturation Ratios: 43 % — ABNORMAL HIGH (ref 17.9–39.5)
TIBC: 319 ug/dL (ref 250–450)
UIBC: 181 ug/dL

## 2019-09-15 LAB — FERRITIN: Ferritin: 70 ng/mL (ref 24–336)

## 2019-09-16 ENCOUNTER — Encounter: Payer: Self-pay | Admitting: Oncology

## 2019-09-16 ENCOUNTER — Inpatient Hospital Stay (HOSPITAL_BASED_OUTPATIENT_CLINIC_OR_DEPARTMENT_OTHER): Payer: BC Managed Care – PPO | Admitting: Oncology

## 2019-09-16 DIAGNOSIS — Z8261 Family history of arthritis: Secondary | ICD-10-CM | POA: Diagnosis not present

## 2019-09-16 DIAGNOSIS — Z823 Family history of stroke: Secondary | ICD-10-CM | POA: Diagnosis not present

## 2019-09-16 DIAGNOSIS — R7401 Elevation of levels of liver transaminase levels: Secondary | ICD-10-CM | POA: Diagnosis not present

## 2019-09-16 DIAGNOSIS — R42 Dizziness and giddiness: Secondary | ICD-10-CM | POA: Diagnosis not present

## 2019-09-16 DIAGNOSIS — Z818 Family history of other mental and behavioral disorders: Secondary | ICD-10-CM | POA: Diagnosis not present

## 2019-09-16 DIAGNOSIS — Z880 Allergy status to penicillin: Secondary | ICD-10-CM | POA: Diagnosis not present

## 2019-09-16 DIAGNOSIS — Z79899 Other long term (current) drug therapy: Secondary | ICD-10-CM | POA: Diagnosis not present

## 2019-09-16 DIAGNOSIS — Z8249 Family history of ischemic heart disease and other diseases of the circulatory system: Secondary | ICD-10-CM | POA: Diagnosis not present

## 2019-09-16 DIAGNOSIS — Z836 Family history of other diseases of the respiratory system: Secondary | ICD-10-CM | POA: Diagnosis not present

## 2019-09-16 NOTE — Progress Notes (Signed)
Patient denies new problems/concerns today.   °

## 2019-09-16 NOTE — Progress Notes (Signed)
Hematology/Oncology Follow Up Visit [facility] Telephone:([phone] [phone]   Patient Care Team: [name], MD as PCP - General (Family Medicine)  Referring Physician Dr.[email or web address].Eric  REASON FOR VISIT Follow up for treatment of hemochromatosis and iron overload  PERTINENT HEMATOLOGY HISTORY Cole Lane 44 y.o.  male with past medical history listed as per low who was referred by Dr. 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 Patient denies any family history of hemochromatosis. He is eating history his health state. He feels well at baseline except sometimes if he stands up pretty fast for dizziness. He has been following up with primary care physician and has been noticed that his liver function tests has been mildly elevated. Additional workup showed high ferritin 353, also high transferrin saturation of 70%. 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 He is married and has no children yet.  He follows up with GI Dr.Anna and has ongoing work up. US abdomen was suggested and has not been done yet.  # Discussed about avoiding alcohol, and uncooked seafood.  #Discussed about screening first degree relative for hereditary hemochromatosis.   INTERVAL HISTORY Cole Lane is a 44 y.o. 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 Patient reports feeling well since last visit. He reports "throat discomfort" denies any significant dysphagia." feels like having a pocket there".  .   Review of Systems  Constitutional: Negative for appetite change, chills, fatigue, fever and unexpected weight change.  HENT:   Negative for hearing loss and voice change.   Eyes: Negative for eye problems and icterus.  Respiratory: Negative for chest  tightness, cough and shortness of breath.   Cardiovascular: Negative for chest pain and leg swelling.  Gastrointestinal: Negative for abdominal distention and abdominal pain.  Endocrine: Negative for hot flashes.  Genitourinary: Negative for difficulty urinating, dysuria and frequency.   Musculoskeletal: Negative for arthralgias.  Skin: Negative for itching and rash.  Neurological: Negative for light-headedness and numbness.  Hematological: Negative for adenopathy. Does not bruise/bleed easily.  Psychiatric/Behavioral: Negative for confusion.    MEDICAL HISTORY:  Past Medical History:  Diagnosis Date  . Allergic rhinitis   . Allergy   . Asthma   . Hemochromatosis   . Kidney stone   . Palpitation     SURGICAL HISTORY: Past Surgical History:  Procedure Laterality Date  . DERMOID CYST  EXCISION      SOCIAL HISTORY: Social History   Socioeconomic History  . Marital status: Single    Spouse name: Not on file  . Number of children: Not on file  . Years of education: Not on file  . Highest education level: Not on file  Occupational History  . Not on file  Tobacco Use  . Smoking status: Never Smoker  . Smokeless tobacco: Never Used  Vaping Use  . Vaping Use: Never used  Substance and Sexual Activity  . Alcohol use: No  . Drug use: No  . Sexual activity: Yes  Other Topics Concern  . Not on file  Social History Narrative  . Not on file   Social Determinants of Health   Financial Resource Strain:   . Difficulty of Paying Living Expenses:   Food Insecurity:   . Worried About Charity fundraiser in the Last Year:   . [place] in the Last Year:  Transportation Needs:   . Film/video editor (Medical):   Marland Kitchen Lack of Transportation (Non-Medical):   Physical Activity:   . Days of Exercise per Week:   . Minutes of Exercise per Session:   Stress:   . Feeling of Stress :   Social Connections:   . Frequency of Communication with Friends and Family:   .  Frequency of Social Gatherings with Friends and Family:   . Attends Religious Services:   . Active Member of Clubs or Organizations:   . Attends Archivist Meetings:   Marland Kitchen Marital Status:   Intimate Partner Violence:   . Fear of Current or Ex-Partner:   . Emotionally Abused:   Marland Kitchen Physically Abused:   . Sexually Abused:     FAMILY HISTORY: Family History  Problem Relation Age of Onset  . Asthma Maternal Aunt   . Hypertension Other        Parent  . Stroke Other        Grandparent  . Mental illness Other        Grandparent  . Arthritis Other        Grandparent  . Hypertension Father     ALLERGIES:  is allergic to penicillin g.  MEDICATIONS:  Current Outpatient Medications  Medication Sig Dispense Refill  . ibuprofen (ADVIL) 800 MG tablet Take 1 tablet (800 mg total) by mouth every 8 (eight) hours as needed. 21 tablet 0  . loratadine (CLARITIN) 10 MG tablet Take 10 mg by mouth daily.     No current facility-administered medications for this visit.      Marland Kitchen  PHYSICAL EXAMINATION: ECOG PERFORMANCE STATUS: 0 - Asymptomatic Vitals:   09/16/19 1303  BP: 119/82  Pulse: 65  Resp: 16  Temp: (!) 97 F (36.1 C)   Filed Weights   09/16/19 1303  Weight: 156 lb 1.6 oz (70.8 kg)   Physical Exam Constitutional:      General: He is not in acute distress.    Appearance: He is not diaphoretic.  HENT:     Head: Normocephalic and atraumatic.     Nose: Nose normal.     Mouth/Throat:     Pharynx: No oropharyngeal exudate.  Eyes:     General: No scleral icterus.       Left eye: No discharge.     Pupils: Pupils are equal, round, and reactive to light.  Neck:     Vascular: No JVD.  Cardiovascular:     Rate and Rhythm: Normal rate and regular rhythm.     Heart sounds: Normal heart sounds. No murmur heard.   Pulmonary:     Effort: Pulmonary effort is normal. No respiratory distress.     Breath sounds: Normal breath sounds. No wheezing or rales.  Chest:     Chest  wall: No tenderness.  Abdominal:     General: Bowel sounds are normal. There is no distension.     Palpations: Abdomen is soft. There is no mass.     Tenderness: There is no abdominal tenderness. There is no rebound.  Musculoskeletal:        General: No tenderness. Normal range of motion.     Cervical back: Normal range of motion and neck supple.  Lymphadenopathy:     Cervical: No cervical adenopathy.  Skin:    General: Skin is warm and dry.     Findings: No erythema or rash.  Neurological:     Mental Status: He is alert and oriented to person,  place, and time.     Cranial Nerves: No cranial nerve deficit.     Motor: No abnormal muscle tone.     Coordination: Coordination normal.  Psychiatric:        Mood and Affect: Mood and affect normal.    LABORATORY DATA:  I have reviewed the data as listed Lab Results  Component Value Date   WBC 5.2 09/15/2019   HGB 14.0 09/15/2019   HCT 41.6 09/15/2019   MCV 90.6 09/15/2019   PLT 181 09/15/2019   Recent Labs    09/17/18 1025 03/17/19 1059 09/15/19 1048  NA 139  --   --   K 4.2  --   --   CL 103  --   --   CO2 29  --   --   GLUCOSE 91  --   --   BUN 11  --   --   CREATININE 0.91  --   --   CALCIUM 9.7  --   --   PROT 7.5 7.9 7.9  ALBUMIN 4.8 4.6 4.7  AST 36 45* 38  ALT 23 25 22   ALKPHOS 61 71 63  BILITOT 0.9 0.7 1.3*  BILIDIR  --  <0.1 0.1  IBILI  --  NOT CALCULATED 1.2*   Iron/TIBC/Ferritin/ %Sat    Component Value Date/Time   IRON 138 09/15/2019 1048   TIBC 319 09/15/2019 1048   FERRITIN 70 09/15/2019 1048   IRONPCTSAT 43 (H) 09/15/2019 1048   IRONPCTSAT 70 (H) 09/26/2016 1115   Hepatitis Panel all negative.  DNA Mutation Analysis              RESULT: COMPOUND HETEROZYGOUS FOR THE C282Y AND H63D MUTATIONS   RADIOGRAPHIC STUDIES: I have personally reviewed the radiological images as listed and agreed with the findings in the report. 02/08/2017 abdomen ultrasoundshowed no focal lesion in the  liver.  Within normal limits in parenchymal echogenicity.  Right renal cyst.      ASSESSMENT & PLAN:  1. Hereditary hemochromatosis ([place])    # Compound heterozygous C282Y/HD63 mutation Labs are reviewed and discussed with patient. Ferritin has slightly increased to 70, iron saturation increased to 43.  Proceed with phlebotomy today  #Mild transaminitis today.  Increased bilirubin to 1.3, increased indirect bilirubin.  Normal direct bilirubin Monitor. #Throat discomfort, recommend patient to discuss with primary care provider for further evaluation.  All questions were answered. The patient knows to call the clinic with any problems questions or concerns. Orders Placed This Encounter  Procedures  . CBC with Differential/Platelet    Standing Status:   Future    Standing Expiration Date:   09/15/2020  . Ferritin    Standing Status:   Future    Standing Expiration Date:   09/15/2020  . Iron and TIBC    Standing Status:   Future    Standing Expiration Date:   09/15/2020  . Hepatic function panel    Standing Status:   Future    Standing Expiration Date:   09/15/2020    Return of visit: CBC,  iron TIBC ferritin in 6 months, MD and in 6 months.   Earlie Server, MD, PhD Hematology Oncology [place] [facility] 09/16/2019

## 2019-09-19 ENCOUNTER — Ambulatory Visit (INDEPENDENT_AMBULATORY_CARE_PROVIDER_SITE_OTHER): Payer: BC Managed Care – PPO | Admitting: Family Medicine

## 2019-09-19 ENCOUNTER — Encounter: Payer: Self-pay | Admitting: Family Medicine

## 2019-09-19 ENCOUNTER — Other Ambulatory Visit: Payer: Self-pay

## 2019-09-19 VITALS — BP 102/68 | HR 79 | Temp 98.1°F | Ht 72.0 in | Wt 154.9 lb

## 2019-09-19 DIAGNOSIS — H9319 Tinnitus, unspecified ear: Secondary | ICD-10-CM | POA: Diagnosis not present

## 2019-09-19 DIAGNOSIS — Z13220 Encounter for screening for lipoid disorders: Secondary | ICD-10-CM

## 2019-09-19 DIAGNOSIS — Z0001 Encounter for general adult medical examination with abnormal findings: Secondary | ICD-10-CM

## 2019-09-19 DIAGNOSIS — J309 Allergic rhinitis, unspecified: Secondary | ICD-10-CM

## 2019-09-19 LAB — BASIC METABOLIC PANEL
BUN: 13 mg/dL (ref 6–23)
CO2: 27 mEq/L (ref 19–32)
Calcium: 9.7 mg/dL (ref 8.4–10.5)
Chloride: 102 mEq/L (ref 96–112)
Creatinine, Ser: 0.91 mg/dL (ref 0.40–1.50)
GFR: 91.55 mL/min (ref >60.00)
Glucose, Bld: 89 mg/dL (ref 70–99)
Potassium: 4.3 mEq/L (ref 3.5–5.1)
Sodium: 137 mEq/L (ref 135–145)

## 2019-09-19 LAB — LIPID PANEL
Cholesterol: 191 mg/dL (ref 0–200)
HDL: 47 mg/dL (ref >39.00)
LDL Cholesterol: 129 mg/dL — ABNORMAL HIGH (ref 0–99)
NonHDL: 144.26
Total CHOL/HDL Ratio: 4
Triglycerides: 78 mg/dL (ref 0.0–149.0)
VLDL: 15.6 mg/dL (ref 0.0–40.0)

## 2019-09-19 MED ORDER — OMEPRAZOLE 20 MG PO CPDR: 20.0000 mg | DELAYED_RELEASE_CAPSULE | Freq: Every day | ORAL | 0 refills | Status: DC

## 2019-09-19 NOTE — Patient Instructions (Signed)
Nice to see you. Check to see if that helps with your throat. I referred you for audiology. We will get lab work today and contact you with results. Please continue your Claritin.

## 2019-09-19 NOTE — Assessment & Plan Note (Signed)
Chronic ongoing issue.  TMs appear normal.  Will refer for audiology evaluation.

## 2019-09-19 NOTE — Progress Notes (Signed)
oo

## 2019-09-19 NOTE — Progress Notes (Signed)
Cole Rumps, MD Phone: [phone]  Cole Lane is a 44 y.o. male who presents today for CPE.  Diet: Generally healthy with lean meats and vegetables.  Occasional crackers though no other junk food. Exercise: Rides his bike 2 to 3 hours a week with an average heart rate in the 150s. Family history-  Prostate cancer: No  Colon cancer: No Vaccines-   Flu: Out of season  Tetanus: Up-to-date  COVID19: Up-to-date HIV screening: Up-to-date Hep C Screening: Up-to-date Tobacco use: No Alcohol use: No Illicit Drug use: No Dentist: Yes Ophthalmology: Due to see them  Throat mucous: Patient notes this has been an ongoing issue for several years.  At night he feels like saliva and mucus builds up in his throat and he will have to get up to spit it out.  He does note occasional postnasal drip.  No rhinorrhea.  Occasional reflux.  Does have some trouble swallowing foods at times in his throat though does not report any issues once the food gets past his throat.  He started back on Claritin recently and that has been somewhat beneficial.  Has tried Flonase in the past with little benefit.  Tinnitus: Left greater than right.  This has been going on for years.  He typically only notices it when it is completely quiet.  There is no pulsatile issue with it.  No hearing loss.  He notes he has his hearing checked through his job every couple of years and notes he has never had any hearing issues on those checks.   Active Ambulatory Problems    Diagnosis Date Noted  . Elevated LFTs 09/26/2016  . Skin lesions 09/26/2016  . Palpitations 09/26/2016  . Allergic rhinitis 09/26/2016  . Vitamin D deficiency 09/26/2016  . Hereditary hemochromatosis ([place]) 10/29/2016  . Iron overload 10/29/2016  . Headache 06/01/2017  . Right foot pain 06/01/2017  . Renal cyst 06/01/2017  . Encounter for general adult medical examination with abnormal findings 09/22/2018  . Memory difficulty 09/22/2018  .  Tinnitus 09/19/2019   Resolved Ambulatory Problems    Diagnosis Date Noted  . No Resolved Ambulatory Problems   Past Medical History:  Diagnosis Date  . Allergy   . Asthma   . Hemochromatosis   . Kidney stone   . Palpitation     Family History  Problem Relation Age of Onset  . Asthma Maternal Aunt   . Hypertension Other        Parent  . Stroke Other        Grandparent  . Mental illness Other        Grandparent  . Arthritis Other        Grandparent  . Hypertension Father     Social History   Socioeconomic History  . Marital status: Single    Spouse name: Not on file  . Number of children: Not on file  . Years of education: Not on file  . Highest education level: Not on file  Occupational History  . Not on file  Tobacco Use  . Smoking status: Never Smoker  . Smokeless tobacco: Never Used  Vaping Use  . Vaping Use: Never used  Substance and Sexual Activity  . Alcohol use: No  . Drug use: No  . Sexual activity: Yes  Other Topics Concern  . Not on file  Social History Narrative  . Not on file   Social Determinants of Health   Financial Resource Strain:   . Difficulty of Paying Living Expenses:  Food Insecurity:   . Worried About Charity fundraiser in the Last Year:   . Arboriculturist in the Last Year:   Transportation Needs:   . Film/video editor (Medical):   Marland Kitchen Lack of Transportation (Non-Medical):   Physical Activity:   . Days of Exercise per Week:   . Minutes of Exercise per Session:   Stress:   . Feeling of Stress :   Social Connections:   . Frequency of Communication with Friends and Family:   . Frequency of Social Gatherings with Friends and Family:   . Attends Religious Services:   . Active Member of Clubs or Organizations:   . Attends Archivist Meetings:   Marland Kitchen Marital Status:   Intimate Partner Violence:   . Fear of Current or Ex-Partner:   . Emotionally Abused:   Marland Kitchen Physically Abused:   . Sexually Abused:      ROS  General:  Negative for nexplained weight loss, fever Skin: Negative for new or changing mole, sore that won't heal HEENT: Positive for tinnitus, trouble swallowing, negative for trouble hearing, trouble seeing, mouth sores, hoarseness, change in voice. CV:  Negative for chest pain, dyspnea, edema, palpitations Resp: Negative for cough, dyspnea, hemoptysis GI: Negative for nausea, vomiting, diarrhea, constipation, abdominal pain, melena, hematochezia. GU: Negative for dysuria, incontinence, urinary hesitance, hematuria, vaginal or penile discharge, polyuria, sexual difficulty, lumps in testicle or breasts MSK: Negative for muscle cramps or aches, joint pain or swelling Neuro: Negative for headaches, weakness, numbness, dizziness, passing out/fainting Psych: Negative for depression, anxiety, memory problems  Objective  Physical Exam Vitals:   09/19/19 0952  BP: 102/68  Pulse: 79  Temp: 98.1 F (36.7 C)  SpO2: 97%    BP Readings from Last 3 Encounters:  09/19/19 102/68  09/16/19 119/82  03/19/19 125/81   Wt Readings from Last 3 Encounters:  09/19/19 154 lb 14.4 oz (70.3 kg)  09/16/19 156 lb 1.6 oz (70.8 kg)  03/19/19 159 lb 12.8 oz (72.5 kg)    Physical Exam Constitutional:      General: He is not in acute distress.    Appearance: He is not diaphoretic.  HENT:     Head: Normocephalic and atraumatic.     Right Ear: Tympanic membrane normal.     Left Ear: Tympanic membrane normal.     Ears:     Comments: Bilateral TMs initially obscured by cerumen, these were irrigated by CMA and tolerated well by the patient revealing normal TMs Eyes:     Conjunctiva/sclera: Conjunctivae normal.     Pupils: Pupils are equal, round, and reactive to light.  Cardiovascular:     Rate and Rhythm: Normal rate and regular rhythm.     Heart sounds: Normal heart sounds.  Pulmonary:     Effort: Pulmonary effort is normal.     Breath sounds: Normal breath sounds.  Abdominal:      General: Bowel sounds are normal. There is no distension.     Palpations: Abdomen is soft.     Tenderness: There is no abdominal tenderness. There is no guarding or rebound.  Musculoskeletal:     Right lower leg: No edema.     Left lower leg: No edema.  Skin:    General: Skin is warm and dry.  Neurological:     Mental Status: He is alert.      Assessment/Plan:   Encounter for general adult medical examination with abnormal findings Physical exam completed.  Encouraged continued  healthy diet and exercise.  Vaccines up-to-date.  Encouraged him to see an eye doctor.  Lab work as outlined below.  Allergic rhinitis Symptoms could be related to allergies.  He also could be having some reflux.  He will continue Claritin given that symptoms have improved some.  We will start him on omeprazole for 2 weeks and see him back in a month.  If he still having issues with swallowing and mucus production will refer to GI.  Tinnitus Chronic ongoing issue.  TMs appear normal.  Will refer for audiology evaluation.   Orders Placed This Encounter  Procedures  . Basic Metabolic Panel (BMET)  . Lipid panel  . Ambulatory referral to Audiology    Referral Priority:   Routine    Referral Type:   Audiology Exam    Referral Reason:   Specialty Services Required    Number of Visits Requested:   1    Meds ordered this encounter  Medications  . omeprazole (PRILOSEC) 20 MG capsule    Sig: Take 1 capsule (20 mg total) by mouth daily.    Dispense:  14 capsule    Refill:  0    This visit occurred during the SARS-CoV-2 public health emergency.  Safety protocols were in place, including screening questions prior to the visit, additional usage of staff PPE, and extensive cleaning of exam room while observing appropriate contact time as indicated for disinfecting solutions.    Cole Rumps, MD [place]

## 2019-09-19 NOTE — Assessment & Plan Note (Signed)
Physical exam completed.  Encouraged continued healthy diet and exercise.  Vaccines up-to-date.  Encouraged him to see an eye doctor.  Lab work as outlined below.

## 2019-09-19 NOTE — Assessment & Plan Note (Signed)
Symptoms could be related to allergies.  He also could be having some reflux.  He will continue Claritin given that symptoms have improved some.  We will start him on omeprazole for 2 weeks and see him back in a month.  If he still having issues with swallowing and mucus production will refer to GI.

## 2019-09-30 ENCOUNTER — Other Ambulatory Visit: Payer: Self-pay

## 2019-09-30 ENCOUNTER — Inpatient Hospital Stay: Payer: BC Managed Care – PPO | Attending: Oncology

## 2019-10-02 ENCOUNTER — Ambulatory Visit: Payer: BC Managed Care – PPO | Attending: Audiologist | Admitting: Audiologist

## 2019-10-02 ENCOUNTER — Other Ambulatory Visit: Payer: Self-pay

## 2019-10-02 DIAGNOSIS — H9313 Tinnitus, bilateral: Secondary | ICD-10-CM | POA: Insufficient documentation

## 2019-10-02 NOTE — Procedures (Signed)
  Outpatient Audiology and [place], [place]  [phone]  AUDIOLOGICAL  EVALUATION  NAME: Cole Lane     DOB:   14-Jul-1975      MRN: [id number]                                                                                     DATE: 10/02/2019     REFERENT: [name], MD STATUS: Outpatient DIAGNOSIS: Tinnitus of both ears  History: Cole Lane was seen for an audiological evaluation. Cole Lane is receiving a hearing evaluation due to concerns for tinnitus. Cole Lane denies any difficulty hearing. This tinnitus began gradually. No pain or pressure reported in either ear. Tinnitus present in both ears. Cole Lane denies any significant history of noise exposure. Tinnitus is only noticeable or bothersome at night when trying to sleep.  Cole Lane recently had cerumen removed from both ears. Cerumen buildup is a constant issue for him. He said the tinnitus can be worse when he is congested.  Medical history negative for any risk factor for hearing loss. No other relevant case history reported.    Evaluation:   Otoscopy showed a clear view of the tympanic membranes, bilaterally  Tympanometry results were consistent with normal middle ear function   Audiometric testing was completed using conventional audiometry with supra-aural transducer. Speech Recognition Thresholds were consistent with pure tone averages. Word Recognition was excellent at conversation level. Pure tone thresholds show normal hearing in both ears. Test results are consistent with excellent hearing and speech understanding.   Tinnitus measures not performed. In the sound proof booth Cole Lane was unable to hear the tinnitus.   Results:  The test results were reviewed with Cole Lane. He was given a copy of his hearing test. He has excellent hearing in both ears. His mild tinnitus is not bothersome or impactful. He only notices it occasionally at night or in quiet. He was advised  that most people have some degree of occasional ringing in the ears, and if it ever becomes more prevalent or bothersome to follow up with audiology. He reported understanding.    Recommendations: 1.   No further audiologic testing is needed unless future hearing concerns arise.    Cole Lane  Audiologist, Au.D., CCC-A 10/02/2019  2:18 PM  Cc: [name], MD

## 2019-10-24 ENCOUNTER — Telehealth (INDEPENDENT_AMBULATORY_CARE_PROVIDER_SITE_OTHER): Payer: BC Managed Care – PPO | Admitting: Family Medicine

## 2019-10-24 ENCOUNTER — Encounter: Payer: Self-pay | Admitting: Family Medicine

## 2019-10-24 ENCOUNTER — Other Ambulatory Visit: Payer: Self-pay

## 2019-10-24 DIAGNOSIS — E7800 Pure hypercholesterolemia, unspecified: Secondary | ICD-10-CM | POA: Insufficient documentation

## 2019-10-24 DIAGNOSIS — J309 Allergic rhinitis, unspecified: Secondary | ICD-10-CM | POA: Diagnosis not present

## 2019-10-24 DIAGNOSIS — H9319 Tinnitus, unspecified ear: Secondary | ICD-10-CM | POA: Diagnosis not present

## 2019-10-24 MED ORDER — FEXOFENADINE HCL 180 MG PO TABS: 180.0000 mg | ORAL_TABLET | Freq: Every day | ORAL | 2 refills | Status: DC

## 2019-10-24 NOTE — Assessment & Plan Note (Signed)
Discussed ASCVD risk score.  Advised he does not meet criteria for medication.  Encouraged continued exercise and monitoring his diet.

## 2019-10-24 NOTE — Progress Notes (Signed)
Virtual Visit via video Note  This visit type was conducted due to national recommendations for restrictions regarding the COVID-19 pandemic (e.g. social distancing).  This format is felt to be most appropriate for this patient at this time.  All issues noted in this document were discussed and addressed.  No physical exam was performed (except for noted visual exam findings with Video Visits).   I connected with Cole Lane today at  9:30 AM EDT by a video enabled telemedicine application or telephone and verified that I am speaking with the correct person using two identifiers. Location patient: home Location provider: work Persons participating in the virtual visit: patient, provider  I discussed the limitations, risks, security and privacy concerns of performing an evaluation and management service by telephone and the availability of in person appointments. I also discussed with the patient that there may be a patient responsible charge related to this service. The patient expressed understanding and agreed to proceed.   Reason for visit: f/u.  HPI: Postnasal drip: Patient continues to have issues with this.  It is a chronic intermittent issue though its been going on at a low level since sometime in July.  Notes after he eats his nose will run.  Some clear mucus out of his nose.  Claritin is not beneficial.  Flonase in the past has not been beneficial.  We tried omeprazole to see if reflux was contributing to the sensation though that was not helpful either.  He does report a history of a deviated septum.  Elevated LDL: Patient notes his cholesterol has seem to be trending up based on his lab work.  He has not made any significant diet changes.  He does not eat much fried or fatty foods.  He has not altered his carbohydrate intake.  He has actually increased his exercise.  The 10-year ASCVD risk score Mikey Bussing DC Brooke Bonito., et al., 2013) is: 0.9%   Values used to calculate the score:      Age: 44 years     Sex: Male     Is Non-Hispanic African American: No     Diabetic: No     Tobacco smoker: No     Systolic Blood Pressure: 267 mmHg     Is BP treated: No     HDL Cholesterol: 47 mg/dL     Total Cholesterol: 191 mg/dL   Tinnitus: Patient had an audiology evaluation which was reassuring.  Notes things have to be extremely quiet for him to be able to hear the ringing.  ROS: See pertinent positives and negatives per HPI.  Past Medical History:  Diagnosis Date  . Allergic rhinitis   . Allergy   . Asthma   . Hemochromatosis   . Kidney stone   . Palpitation     Past Surgical History:  Procedure Laterality Date  . DERMOID CYST  EXCISION      Family History  Problem Relation Age of Onset  . Asthma Maternal Aunt   . Hypertension Other        Parent  . Stroke Other        Grandparent  . Mental illness Other        Grandparent  . Arthritis Other        Grandparent  . Hypertension Father     SOCIAL HX: Non-smoker   Current Outpatient Medications:  .  cholecalciferol (VITAMIN D3) 25 MCG (1000 UNIT) tablet, Take 1,000 Units by mouth daily. OVC, Disp: , Rfl:  .  ibuprofen (ADVIL) 800 MG tablet, Take 1 tablet (800 mg total) by mouth every 8 (eight) hours as needed., Disp: 21 tablet, Rfl: 0 .  fexofenadine (ALLEGRA ALLERGY) 180 MG tablet, Take 1 tablet (180 mg total) by mouth daily., Disp: 30 tablet, Rfl: 2  EXAM:  VITALS per patient if applicable:  GENERAL: alert, oriented, appears well and in no acute distress  HEENT: atraumatic, conjunttiva clear, no obvious abnormalities on inspection of external nose and ears  NECK: normal movements of the head and neck  LUNGS: on inspection no signs of respiratory distress, breathing rate appears normal, no obvious gross SOB, gasping or wheezing  CV: no obvious cyanosis  MS: moves all visible extremities without noticeable abnormality  PSYCH/NEURO: pleasant and cooperative, no obvious depression or anxiety,  speech and thought processing grossly intact  ASSESSMENT AND PLAN:  Discussed the following assessment and plan:  Allergic rhinitis Postnasal drip continues to be an issue.  We will trial him on Allegra.  If that is not beneficial would consider referral to ENT given his deviated septum.  Tinnitus Reassuring evaluation with audiology.  He will monitor for any hearing loss, worsening tinnitus, or unilateral symptoms.  Elevated LDL cholesterol level Discussed ASCVD risk score.  Advised he does not meet criteria for medication.  Encouraged continued exercise and monitoring his diet.   No orders of the defined types were placed in this encounter.   Meds ordered this encounter  Medications  . fexofenadine (ALLEGRA ALLERGY) 180 MG tablet    Sig: Take 1 tablet (180 mg total) by mouth daily.    Dispense:  30 tablet    Refill:  2     I discussed the assessment and treatment plan with the patient. The patient was provided an opportunity to ask questions and all were answered. The patient agreed with the plan and demonstrated an understanding of the instructions.   The patient was advised to call back or seek an in-person evaluation if the symptoms worsen or if the condition fails to improve as anticipated.    [name], MD

## 2019-10-24 NOTE — Assessment & Plan Note (Signed)
Postnasal drip continues to be an issue.  We will trial him on Allegra.  If that is not beneficial would consider referral to ENT given his deviated septum.

## 2019-10-24 NOTE — Assessment & Plan Note (Signed)
Reassuring evaluation with audiology.  He will monitor for any hearing loss, worsening tinnitus, or unilateral symptoms.

## 2019-12-26 DIAGNOSIS — Z23 Encounter for immunization: Secondary | ICD-10-CM | POA: Diagnosis not present

## 2020-01-23 DIAGNOSIS — Z0189 Encounter for other specified special examinations: Secondary | ICD-10-CM | POA: Diagnosis not present

## 2020-01-26 DIAGNOSIS — Z043 Encounter for examination and observation following other accident: Secondary | ICD-10-CM | POA: Diagnosis not present

## 2020-01-26 DIAGNOSIS — Z713 Dietary counseling and surveillance: Secondary | ICD-10-CM | POA: Diagnosis not present

## 2020-01-26 DIAGNOSIS — R748 Abnormal levels of other serum enzymes: Secondary | ICD-10-CM | POA: Diagnosis not present

## 2020-03-10 ENCOUNTER — Encounter: Payer: Self-pay | Admitting: Family Medicine

## 2020-03-15 ENCOUNTER — Inpatient Hospital Stay: Payer: BC Managed Care – PPO | Attending: Oncology

## 2020-03-15 DIAGNOSIS — D696 Thrombocytopenia, unspecified: Secondary | ICD-10-CM | POA: Diagnosis not present

## 2020-03-15 DIAGNOSIS — R7401 Elevation of levels of liver transaminase levels: Secondary | ICD-10-CM | POA: Diagnosis not present

## 2020-03-15 LAB — CBC WITH DIFFERENTIAL/PLATELET
Abs Immature Granulocytes: 0.01 10*3/uL (ref 0.00–0.07)
Basophils Absolute: 0 10*3/uL (ref 0.0–0.1)
Basophils Relative: 1 %
Eosinophils Absolute: 0.1 10*3/uL (ref 0.0–0.5)
Eosinophils Relative: 1 %
HCT: 42.8 % (ref 39.0–52.0)
Hemoglobin: 14.3 g/dL (ref 13.0–17.0)
Immature Granulocytes: 0 %
Lymphocytes Relative: 31 %
Lymphs Abs: 1.6 10*3/uL (ref 0.7–4.0)
MCH: 30.7 pg (ref 26.0–34.0)
MCHC: 33.4 g/dL (ref 30.0–36.0)
MCV: 91.8 fL (ref 80.0–100.0)
Monocytes Absolute: 0.4 10*3/uL (ref 0.1–1.0)
Monocytes Relative: 8 %
Neutro Abs: 3.1 10*3/uL (ref 1.7–7.7)
Neutrophils Relative %: 59 %
Platelets: 125 10*3/uL — ABNORMAL LOW (ref 150–400)
RBC: 4.66 MIL/uL (ref 4.22–5.81)
RDW: 12 % (ref 11.5–15.5)
WBC: 5.2 10*3/uL (ref 4.0–10.5)
nRBC: 0 % (ref 0.0–0.2)

## 2020-03-15 LAB — IRON AND TIBC
Iron: 137 ug/dL (ref 45–182)
Saturation Ratios: 39 % (ref 17.9–39.5)
TIBC: 356 ug/dL (ref 250–450)
UIBC: 219 ug/dL

## 2020-03-15 LAB — HEPATIC FUNCTION PANEL
ALT: 33 U/L (ref 0–44)
AST: 48 U/L — ABNORMAL HIGH (ref 15–41)
Albumin: 4.9 g/dL (ref 3.5–5.0)
Alkaline Phosphatase: 73 U/L (ref 38–126)
Bilirubin, Direct: 0.2 mg/dL (ref 0.0–0.2)
Indirect Bilirubin: 0.8 mg/dL (ref 0.3–0.9)
Total Bilirubin: 1 mg/dL (ref 0.3–1.2)
Total Protein: 8.6 g/dL — ABNORMAL HIGH (ref 6.5–8.1)

## 2020-03-15 LAB — FERRITIN: Ferritin: 77 ng/mL (ref 24–336)

## 2020-03-16 ENCOUNTER — Inpatient Hospital Stay: Payer: BC Managed Care – PPO | Admitting: Oncology

## 2020-03-16 ENCOUNTER — Encounter: Payer: Self-pay | Admitting: Oncology

## 2020-03-16 DIAGNOSIS — R7401 Elevation of levels of liver transaminase levels: Secondary | ICD-10-CM | POA: Diagnosis not present

## 2020-03-16 DIAGNOSIS — D696 Thrombocytopenia, unspecified: Secondary | ICD-10-CM

## 2020-03-16 NOTE — Progress Notes (Signed)
Pt here for follow up. No new concerns voiced.   

## 2020-03-16 NOTE — Progress Notes (Signed)
Hematology/Oncology Follow Up Visit [facility] [phone] [phone]   Patient Care Team: [name], MD as PCP - General (Family Medicine)  Referring Physician Dr.Sonnernberg.Eric  REASON FOR VISIT Follow up for treatment of hemochromatosis and iron overload  PERTINENT HEMATOLOGY HISTORY Cole Lane 45 y.o.  male with past medical history listed as per low who was referred by Dr. 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 Patient denies any family history of hemochromatosis. He is eating history his health state. He feels well at baseline except sometimes if he stands up pretty fast for dizziness. He has been following up with primary care physician and has been noticed that his liver function tests has been mildly elevated. Additional workup showed high ferritin 353, also high transferrin saturation of 70%. 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 He is married and has no children yet.  He follows up with GI Dr.Anna and has ongoing work up. US abdomen was suggested and has not been done yet.  # Discussed about avoiding alcohol, and uncooked seafood.  #Discussed about screening first degree relative for hereditary hemochromatosis.   INTERVAL HISTORY Cole Lane is a 45 y.o. 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 Patient reports feeling well since last visit. No recent new diagnosis, infection.     Review of Systems  Constitutional: Negative for appetite change, chills, fatigue, fever and unexpected weight change.  HENT:   Negative for hearing loss and voice change.   Eyes: Negative for eye problems and icterus.  Respiratory: Negative for chest tightness, cough and shortness of breath.   Cardiovascular:  Negative for chest pain and leg swelling.  Gastrointestinal: Negative for abdominal distention and abdominal pain.  Endocrine: Negative for hot flashes.  Genitourinary: Negative for difficulty urinating, dysuria and frequency.   Musculoskeletal: Negative for arthralgias.  Skin: Negative for itching and rash.  Neurological: Negative for light-headedness and numbness.  Hematological: Negative for adenopathy. Does not bruise/bleed easily.  Psychiatric/Behavioral: Negative for confusion.    MEDICAL HISTORY:  Past Medical History:  Diagnosis Date  . Allergic rhinitis   . Allergy   . Asthma   . Hemochromatosis   . Kidney stone   . Palpitation     SURGICAL HISTORY: Past Surgical History:  Procedure Laterality Date  . DERMOID CYST  EXCISION      SOCIAL HISTORY: Social History   Socioeconomic History  . Marital status: Married    Spouse name: Not on file  . Number of children: Not on file  . Years of education: Not on file  . Highest education level: Not on file  Occupational History  . Not on file  Tobacco Use  . Smoking status: Never Smoker  . Smokeless tobacco: Never Used  Vaping Use  . Vaping Use: Never used  Substance and Sexual Activity  . Alcohol use: No  . Drug use: No  . Sexual activity: Yes  Other Topics Concern  . Not on file  Social History Narrative  . Not on file   Social Determinants of Health   Financial Resource Strain: Not on file  Food Insecurity: Not on file  Transportation Needs: Not on file  Physical Activity: Not on file  Stress: Not on file  Social Connections: Not on file  Intimate Partner Violence: Not on file    FAMILY HISTORY: Family History  Problem Relation Age of Onset  . Asthma Maternal Aunt   . Hypertension Other        Parent  . Stroke Other        Grandparent  . Mental illness Other        Grandparent  . Arthritis Other        Grandparent  . Hypertension Father     ALLERGIES:  is allergic to penicillin  g.  MEDICATIONS:  Current Outpatient Medications  Medication Sig Dispense Refill  . cholecalciferol (VITAMIN D3) 25 MCG (1000 UNIT) tablet Take 1,000 Units by mouth daily. OVC    . ibuprofen (ADVIL) 800 MG tablet Take 1 tablet (800 mg total) by mouth every 8 (eight) hours as needed. 21 tablet 0   No current facility-administered medications for this visit.      Marland Kitchen  PHYSICAL EXAMINATION: ECOG PERFORMANCE STATUS: 0 - Asymptomatic Vitals:   03/16/20 1337  BP: 116/78  Pulse: 61  Resp: 18  Temp: 98 F (36.7 C)   Filed Weights   03/16/20 1337  Weight: 158 lb (71.7 kg)   Physical Exam Constitutional:      General: He is not in acute distress.    Appearance: He is not diaphoretic.  HENT:     Head: Normocephalic and atraumatic.     Nose: Nose normal.     Mouth/Throat:     Pharynx: No oropharyngeal exudate.  Eyes:     General: No scleral icterus.       Left eye: No discharge.     Pupils: Pupils are equal, round, and reactive to light.  Neck:     Vascular: No JVD.  Cardiovascular:     Rate and Rhythm: Normal rate and regular rhythm.     Heart sounds: Normal heart sounds. No murmur heard.   Pulmonary:     Effort: Pulmonary effort is normal. No respiratory distress.     Breath sounds: Normal breath sounds. No wheezing or rales.  Chest:     Chest wall: No tenderness.  Abdominal:     General: Bowel sounds are normal. There is no distension.     Palpations: Abdomen is soft. There is no mass.     Tenderness: There is no abdominal tenderness. There is no rebound.  Musculoskeletal:        General: No tenderness. Normal range of motion.     Cervical back: Normal range of motion and neck supple.  Lymphadenopathy:     Cervical: No cervical adenopathy.  Skin:    General: Skin is warm and dry.     Findings: No erythema or rash.  Neurological:     Mental Status: He is alert and oriented to person, place, and time.     Cranial Nerves: No cranial nerve deficit.     Motor: No  abnormal muscle tone.     Coordination: Coordination normal.  Psychiatric:        Mood and Affect: Mood and affect normal.    LABORATORY DATA:  I have reviewed the data as listed Lab Results  Component Value Date   WBC 5.2 03/15/2020   HGB 14.3 03/15/2020   HCT 42.8 03/15/2020   MCV 91.8 03/15/2020   PLT 125 (L) 03/15/2020   Recent Labs    09/15/19 1048 09/19/19 1031 03/15/20 1058  NA  --  137  --   K  --  4.3  --   CL  --  102  --   CO2  --  27  --   GLUCOSE  --  89  --   BUN  --  13  --   CREATININE  --  0.91  --   CALCIUM  --  9.7  --   PROT 7.9  --  8.6*  ALBUMIN 4.7  --  4.9  AST 38  --  48*  ALT 22  --  33  ALKPHOS 63  --  73  BILITOT 1.3*  --  1.0  BILIDIR 0.1  --  0.2  IBILI 1.2*  --  0.8   Iron/TIBC/Ferritin/ %Sat    Component Value Date/Time   IRON 137 03/15/2020 1058   TIBC 356 03/15/2020 1058   FERRITIN 77 03/15/2020 1058   IRONPCTSAT 39 03/15/2020 1058   IRONPCTSAT 70 (H) 09/26/2016 1115   Hepatitis Panel all negative.  DNA Mutation Analysis              RESULT: COMPOUND HETEROZYGOUS FOR THE C282Y AND H63D MUTATIONS   RADIOGRAPHIC STUDIES: I have personally reviewed the radiological images as listed and agreed with the findings in the report. 02/08/2017 abdomen ultrasoundshowed no focal lesion in the liver.  Within normal limits in parenchymal echogenicity.  Right renal cyst.      ASSESSMENT & PLAN:  1. Hereditary hemochromatosis ([place])   2. Transaminitis   3. 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 Ferritin is 77, iron saturation is 39 Proceed with 575ml phlebotomy.   # Transaminitis, stable.  # Thrombocytopenia, platelet is 125000 today, monitor. Repeat cbc in 3 months.    All questions were answered. The patient knows to call the clinic with any problems questions or concerns. No orders of the defined types were placed in this  encounter.   Return of visit: CBC,  iron TIBC ferritin in 6 months, MD +/- phlebotomy.   Earlie Server, MD, PhD Hematology Oncology [place] [facility] 03/16/2020

## 2020-03-22 ENCOUNTER — Inpatient Hospital Stay: Payer: BC Managed Care – PPO

## 2020-03-22 DIAGNOSIS — R7401 Elevation of levels of liver transaminase levels: Secondary | ICD-10-CM | POA: Diagnosis not present

## 2020-03-22 DIAGNOSIS — D696 Thrombocytopenia, unspecified: Secondary | ICD-10-CM | POA: Diagnosis not present

## 2020-03-22 NOTE — Patient Instructions (Signed)

## 2020-03-22 NOTE — Progress Notes (Signed)
Tolerated 250 ml phlebotomy today. Received drink and crackers. No complaints at discharge. VSS.

## 2020-03-23 IMAGING — CR DG FOREARM 2V*R*
1 series · 2 of 2 positions shown · non-contrast
Comparison: None.

CLINICAL DATA: Right forearm pain after fall yesterday.

EXAM:
RIGHT FOREARM - 2 VIEW

[Series 1: dg forearm right · 0.14mm/px · 2 of 2 slices shown]
[im 1/2]
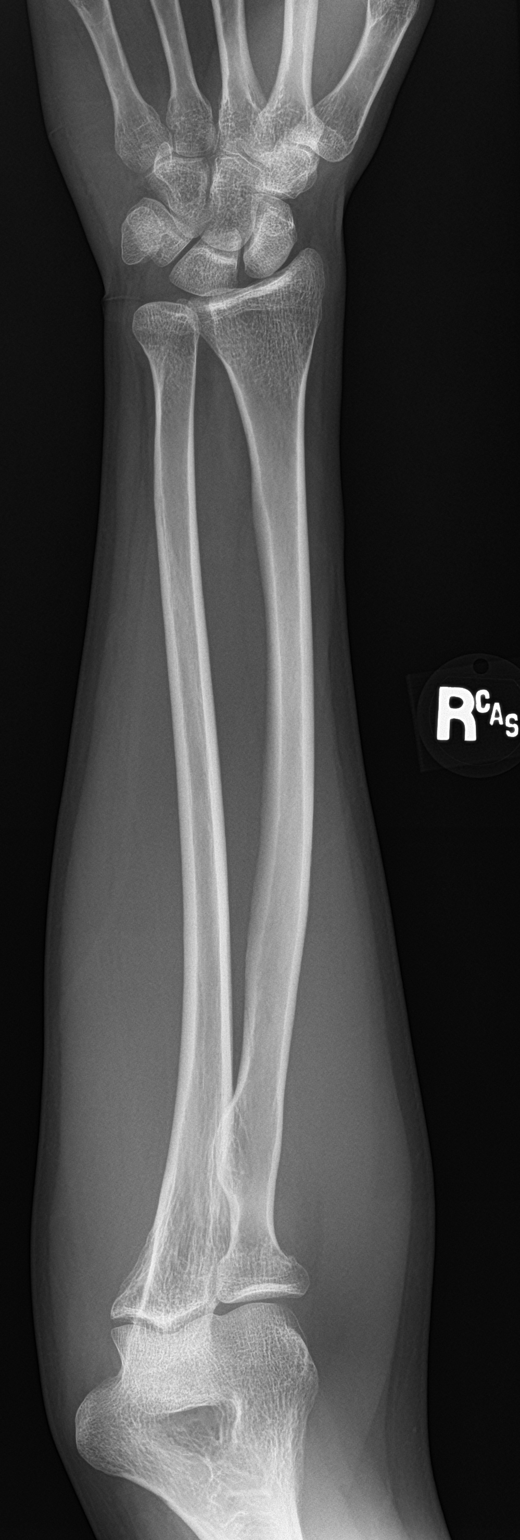
[im 2/2]
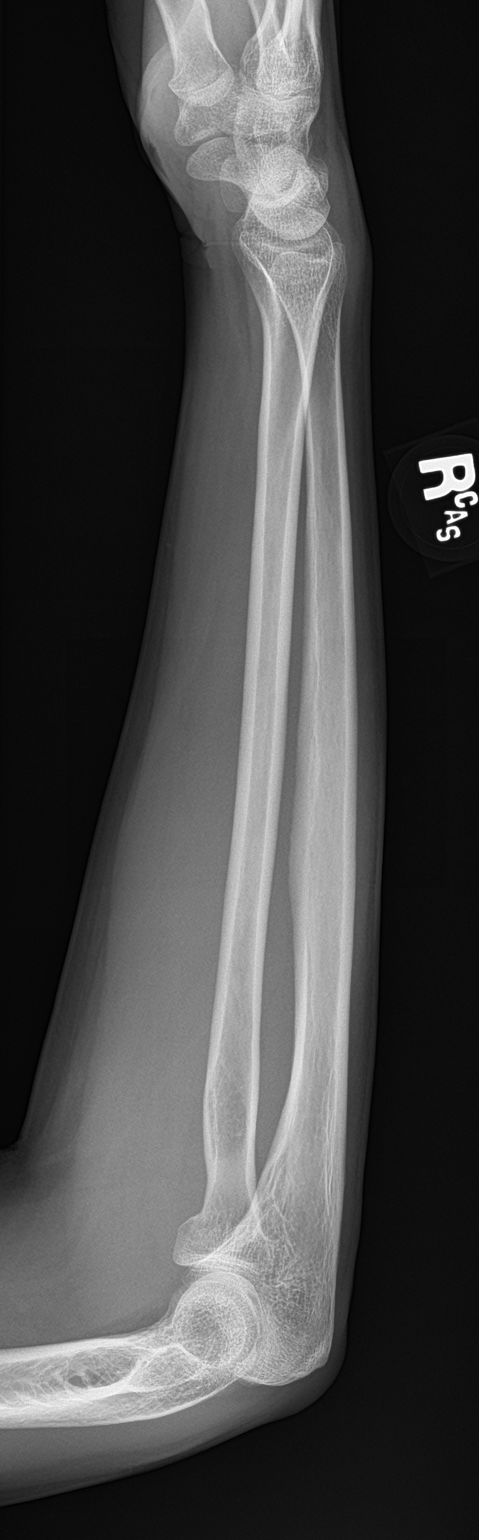

[2 of 2 positions shown; findings below may reference images not displayed]

FINDINGS: There is no evidence of fracture or other focal bone lesions. Soft
tissues are unremarkable.
IMPRESSION: Negative.

## 2020-06-14 ENCOUNTER — Other Ambulatory Visit: Payer: Self-pay

## 2020-06-14 ENCOUNTER — Inpatient Hospital Stay: Payer: BC Managed Care – PPO | Attending: Oncology

## 2020-06-14 DIAGNOSIS — R7401 Elevation of levels of liver transaminase levels: Secondary | ICD-10-CM | POA: Diagnosis not present

## 2020-06-14 DIAGNOSIS — D696 Thrombocytopenia, unspecified: Secondary | ICD-10-CM | POA: Insufficient documentation

## 2020-06-14 LAB — CBC WITH DIFFERENTIAL/PLATELET
Abs Immature Granulocytes: 0.01 10*3/uL (ref 0.00–0.07)
Basophils Absolute: 0 10*3/uL (ref 0.0–0.1)
Basophils Relative: 1 %
Eosinophils Absolute: 0.1 10*3/uL (ref 0.0–0.5)
Eosinophils Relative: 1 %
HCT: 41.4 % (ref 39.0–52.0)
Hemoglobin: 14 g/dL (ref 13.0–17.0)
Immature Granulocytes: 0 %
Lymphocytes Relative: 30 %
Lymphs Abs: 1.6 10*3/uL (ref 0.7–4.0)
MCH: 31 pg (ref 26.0–34.0)
MCHC: 33.8 g/dL (ref 30.0–36.0)
MCV: 91.6 fL (ref 80.0–100.0)
Monocytes Absolute: 0.4 10*3/uL (ref 0.1–1.0)
Monocytes Relative: 8 %
Neutro Abs: 3.2 10*3/uL (ref 1.7–7.7)
Neutrophils Relative %: 60 %
Platelets: 179 10*3/uL (ref 150–400)
RBC: 4.52 MIL/uL (ref 4.22–5.81)
RDW: 11.2 % — ABNORMAL LOW (ref 11.5–15.5)
WBC: 5.2 10*3/uL (ref 4.0–10.5)
nRBC: 0 % (ref 0.0–0.2)

## 2020-09-08 ENCOUNTER — Other Ambulatory Visit: Payer: Self-pay

## 2020-09-09 ENCOUNTER — Inpatient Hospital Stay: Payer: BC Managed Care – PPO | Attending: Oncology

## 2020-09-09 ENCOUNTER — Other Ambulatory Visit: Payer: Self-pay

## 2020-09-09 LAB — CBC WITH DIFFERENTIAL/PLATELET
Abs Immature Granulocytes: 0.01 10*3/uL (ref 0.00–0.07)
Basophils Absolute: 0.1 10*3/uL (ref 0.0–0.1)
Basophils Relative: 1 %
Eosinophils Absolute: 0.1 10*3/uL (ref 0.0–0.5)
Eosinophils Relative: 1 %
HCT: 40.7 % (ref 39.0–52.0)
Hemoglobin: 13.6 g/dL (ref 13.0–17.0)
Immature Granulocytes: 0 %
Lymphocytes Relative: 30 %
Lymphs Abs: 1.7 10*3/uL (ref 0.7–4.0)
MCH: 30.6 pg (ref 26.0–34.0)
MCHC: 33.4 g/dL (ref 30.0–36.0)
MCV: 91.7 fL (ref 80.0–100.0)
Monocytes Absolute: 0.4 10*3/uL (ref 0.1–1.0)
Monocytes Relative: 7 %
Neutro Abs: 3.3 10*3/uL (ref 1.7–7.7)
Neutrophils Relative %: 61 %
Platelets: 183 10*3/uL (ref 150–400)
RBC: 4.44 MIL/uL (ref 4.22–5.81)
RDW: 11.6 % (ref 11.5–15.5)
WBC: 5.5 10*3/uL (ref 4.0–10.5)
nRBC: 0 % (ref 0.0–0.2)

## 2020-09-09 LAB — HEPATIC FUNCTION PANEL
ALT: 23 U/L (ref 0–44)
AST: 41 U/L (ref 15–41)
Albumin: 4.5 g/dL (ref 3.5–5.0)
Alkaline Phosphatase: 78 U/L (ref 38–126)
Bilirubin, Direct: <0.1 mg/dL (ref 0.0–0.2)
Total Bilirubin: 0.6 mg/dL (ref 0.3–1.2)
Total Protein: 7.5 g/dL (ref 6.5–8.1)

## 2020-09-09 LAB — IRON AND TIBC
Iron: 132 ug/dL (ref 45–182)
Saturation Ratios: 41 % — ABNORMAL HIGH (ref 17.9–39.5)
TIBC: 323 ug/dL (ref 250–450)
UIBC: 191 ug/dL

## 2020-09-09 LAB — FERRITIN: Ferritin: 77 ng/mL (ref 24–336)

## 2020-09-10 ENCOUNTER — Inpatient Hospital Stay: Payer: BC Managed Care – PPO

## 2020-09-13 ENCOUNTER — Inpatient Hospital Stay: Payer: BC Managed Care – PPO

## 2020-09-13 ENCOUNTER — Inpatient Hospital Stay: Payer: BC Managed Care – PPO | Admitting: Oncology

## 2020-09-14 ENCOUNTER — Inpatient Hospital Stay: Payer: BC Managed Care – PPO | Admitting: Oncology

## 2020-09-14 ENCOUNTER — Inpatient Hospital Stay: Payer: BC Managed Care – PPO

## 2020-09-14 NOTE — Progress Notes (Signed)
Hematology/Oncology Follow Up Visit [facility] [phone] [phone]   Patient Care Team: [name], MD as PCP - General (Family Medicine)  Referring Physician Dr.Sonnernberg.Eric  REASON FOR VISIT Follow up for treatment of hemochromatosis and iron overload  PERTINENT HEMATOLOGY HISTORY Cole Lane 45 y.o.  male with past medical history listed as per low who was referred by Dr. 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 Patient denies any family history of hemochromatosis. He is eating history his health state. He feels well at baseline except sometimes if he stands up pretty fast for dizziness. He has been following up with primary care physician and has been noticed that his liver function tests has been mildly elevated. Additional workup showed high ferritin 353, also high transferrin saturation of 70%. 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 He is married and has no children yet.  He follows up with GI Dr.Anna and has ongoing work up. US abdomen was suggested and has not been done yet.  # Discussed about avoiding alcohol, and uncooked seafood.  #Discussed about screening first degree relative for hereditary hemochromatosis.   INTERVAL HISTORY Cole Lane is a 45 y.o. male who has above history who presents today for follow-up for hereditary hemochromatosis.  He last received a phlebotomy in January 2022.   He denies any changes since his last visit.  Reports he has never been symptomatic.  Is concerned about possibly depleting his levels too much that it may affect his exercise routine.  Currently having no symptoms.    Review of Systems  Constitutional: Negative.  Negative for appetite change, chills, fatigue and fever.  HENT:  Negative.  Negative for hearing loss, lump/mass, mouth sores and nosebleeds.   Eyes: Negative.   Negative for eye problems.  Respiratory:  Negative for cough, hemoptysis and shortness of breath.   Cardiovascular: Negative.  Negative for chest pain and leg swelling.  Gastrointestinal: Negative.  Negative for abdominal pain, blood in stool, constipation, diarrhea, nausea and vomiting.  Endocrine: Negative.  Negative for hot flashes.  Genitourinary: Negative.  Negative for bladder incontinence, difficulty urinating, dysuria, frequency and hematuria.   Musculoskeletal: Negative.  Negative for back pain, flank pain, gait problem and myalgias.  Skin: Negative.  Negative for itching and rash.  Neurological: Negative.  Negative for dizziness, gait problem, headaches, light-headedness and numbness.  Hematological: Negative.  Negative for adenopathy.  Psychiatric/Behavioral:  Negative for confusion. The patient is not nervous/anxious.    MEDICAL HISTORY:  Past Medical History:  Diagnosis Date   Allergic rhinitis    Allergy    Asthma    Hemochromatosis    Kidney stone    Palpitation     SURGICAL HISTORY: Past Surgical History:  Procedure Laterality Date   DERMOID CYST  EXCISION      SOCIAL HISTORY: Social History   Socioeconomic History   Marital status: Married    Spouse name: Not on file   Number of children: Not on file   Years of education: Not on file   Highest education level: Not on file  Occupational History   Not on file  Tobacco Use   Smoking status: Never   Smokeless tobacco: Never  Vaping Use   Vaping Use: Never used  Substance and Sexual Activity   Alcohol use: No   Drug use: No   Sexual activity: Yes  Other Topics Concern   Not on file  Social History Narrative  Not on file   Social Determinants of Health   Financial Resource Strain: Not on file  Food Insecurity: Not on file  Transportation Needs: Not on file  Physical Activity: Not on file  Stress: Not on file  Social Connections: Not on file  Intimate Partner Violence: Not on file    FAMILY  HISTORY: Family History  Problem Relation Age of Onset   Asthma Maternal Aunt    Hypertension Other        Parent   Stroke Other        Grandparent   Mental illness Other        Grandparent   Arthritis Other        Grandparent   Hypertension Father     ALLERGIES:  is allergic to penicillin g.  MEDICATIONS:  Current Outpatient Medications  Medication Sig Dispense Refill   cholecalciferol (VITAMIN D3) 25 MCG (1000 UNIT) tablet Take 1,000 Units by mouth daily. OVC     ibuprofen (ADVIL) 800 MG tablet Take 1 tablet (800 mg total) by mouth every 8 (eight) hours as needed. 21 tablet 0   No current facility-administered medications for this visit.      Marland Kitchen  PHYSICAL EXAMINATION: ECOG PERFORMANCE STATUS: 0 - Asymptomatic There were no vitals filed for this visit.  There were no vitals filed for this visit.  Physical Exam Constitutional:      Appearance: Normal appearance.  HENT:     Head: Normocephalic and atraumatic.  Eyes:     Pupils: Pupils are equal, round, and reactive to light.  Cardiovascular:     Rate and Rhythm: Normal rate and regular rhythm.     Heart sounds: Normal heart sounds. No murmur heard. Pulmonary:     Effort: Pulmonary effort is normal.     Breath sounds: Normal breath sounds. No wheezing.  Abdominal:     General: Bowel sounds are normal. There is no distension.     Palpations: Abdomen is soft.     Tenderness: There is no abdominal tenderness.  Musculoskeletal:        General: Normal range of motion.     Cervical back: Normal range of motion.  Skin:    General: Skin is warm and dry.     Findings: No rash.  Neurological:     Mental Status: He is alert and oriented to person, place, and time.     Gait: Gait is intact.  Psychiatric:        Mood and Affect: Mood and affect normal.        Cognition and Memory: Memory normal.        Judgment: Judgment normal.   LABORATORY DATA:  I have reviewed the data as listed Lab Results  Component Value  Date   WBC 5.5 09/09/2020   HGB 13.6 09/09/2020   HCT 40.7 09/09/2020   MCV 91.7 09/09/2020   PLT 183 09/09/2020   Recent Labs    09/19/19 1031 03/15/20 1058 09/09/20 1335  NA 137  --   --   K 4.3  --   --   CL 102  --   --   CO2 27  --   --   GLUCOSE 89  --   --   BUN 13  --   --   CREATININE 0.91  --   --   CALCIUM 9.7  --   --   PROT  --  8.6* 7.5  ALBUMIN  --  4.9 4.5  AST  --  48* 41  ALT  --  33 23  ALKPHOS  --  73 78  BILITOT  --  1.0 0.6  BILIDIR  --  0.2 <0.1  IBILI  --  0.8 NOT CALCULATED    Iron/TIBC/Ferritin/ %Sat    Component Value Date/Time   IRON 132 09/09/2020 1335   TIBC 323 09/09/2020 1335   FERRITIN 77 09/09/2020 1335   IRONPCTSAT 41 (H) 09/09/2020 1335   IRONPCTSAT 70 (H) 09/26/2016 1115   Hepatitis Panel all negative.  DNA Mutation Analysis                          RESULT: COMPOUND HETEROZYGOUS FOR THE C282Y AND H63D MUTATIONS   RADIOGRAPHIC STUDIES: I have personally reviewed the radiological images as listed and agreed with the findings in the report. 02/08/2017 abdomen ultrasound showed no focal lesion in the liver.  Within normal limits in parenchymal echogenicity.     Right renal cyst.          ASSESSMENT & PLAN:  No diagnosis found.  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 Labs from 09/09/2020 show ferritin of 77 and iron saturation of 41%.  Hemoglobin is 13.6. Proceed with 500 mL phlebotomy today.  Discussed with Dr. Tasia Catchings who recommends continuing phlebotomy today.  Disposition- RTC in 6 months for repeat labs (CBC, ferritin, iron) MD assessment and possible phlebotomy.  I spent 20 minutes dedicated to the care of this patient (face-to-face and non-face-to-face) on the date of the encounter to include what is described in the assessment and plan.  No orders of the defined types were placed in this  encounter.  Faythe Casa, NP 09/14/2020 1:34 PM

## 2020-09-22 ENCOUNTER — Encounter: Payer: Self-pay | Admitting: Family Medicine

## 2020-09-22 ENCOUNTER — Other Ambulatory Visit: Payer: Self-pay

## 2020-09-22 ENCOUNTER — Ambulatory Visit (INDEPENDENT_AMBULATORY_CARE_PROVIDER_SITE_OTHER): Payer: BC Managed Care – PPO | Admitting: Family Medicine

## 2020-09-22 VITALS — BP 90/60 | HR 63 | Temp 98.3°F | Ht 72.0 in | Wt 152.2 lb

## 2020-09-22 DIAGNOSIS — E7800 Pure hypercholesterolemia, unspecified: Secondary | ICD-10-CM

## 2020-09-22 DIAGNOSIS — E559 Vitamin D deficiency, unspecified: Secondary | ICD-10-CM

## 2020-09-22 DIAGNOSIS — Z0001 Encounter for general adult medical examination with abnormal findings: Secondary | ICD-10-CM | POA: Diagnosis not present

## 2020-09-22 DIAGNOSIS — D229 Melanocytic nevi, unspecified: Secondary | ICD-10-CM

## 2020-09-22 LAB — VITAMIN D 25 HYDROXY (VIT D DEFICIENCY, FRACTURES): VITD: 18.91 ng/mL — ABNORMAL LOW (ref 30.00–100.00)

## 2020-09-22 LAB — BASIC METABOLIC PANEL
BUN: 11 mg/dL (ref 6–23)
CO2: 26 mEq/L (ref 19–32)
Calcium: 9.3 mg/dL (ref 8.4–10.5)
Chloride: 102 mEq/L (ref 96–112)
Creatinine, Ser: 0.82 mg/dL (ref 0.40–1.50)
GFR: 108.29 mL/min (ref >60.00)
Glucose, Bld: 82 mg/dL (ref 70–99)
Potassium: 3.9 mEq/L (ref 3.5–5.1)
Sodium: 138 mEq/L (ref 135–145)

## 2020-09-22 LAB — LIPID PANEL
Cholesterol: 169 mg/dL (ref 0–200)
HDL: 47.7 mg/dL (ref >39.00)
LDL Cholesterol: 109 mg/dL — ABNORMAL HIGH (ref 0–99)
NonHDL: 121.12
Total CHOL/HDL Ratio: 4
Triglycerides: 61 mg/dL (ref 0.0–149.0)
VLDL: 12.2 mg/dL (ref 0.0–40.0)

## 2020-09-22 NOTE — Progress Notes (Signed)
[name], MD Phone: [phone]  Cole Lane is a 45 y.o. male who presents today for CPE.  Diet: Generally healthy, lots of fruits and vegetables, mostly lean meats, no soda or sweet tea Exercise: Rides 30 to 40 miles on his bike weekly. Colonoscopy: Not indicated Prostate cancer screening: Not indicated Family history-  Prostate cancer: No  Colon cancer: No Vaccines-   Flu: Patient will get this through his work  Tetanus: Up-to-date  COVID19: Up-to-date HIV screening: Completed previously Hep C Screening: Completed previously Tobacco use: no Alcohol use: no Illicit Drug use: no Dentist: yes Ophthalmology: yes  Nevi: Patient notes a nevus on his left temple and along his left neck just near the jawline.  He has noted they have enlarged over the last 6 to 12 months.  Notes it is hard to remember how much they have changed otherwise.  He did previously see [facility] dermatology though that was for an unrelated issue.  He does note he nicked the one on his left neck while shaving previously.   Active Ambulatory Problems    Diagnosis Date Noted   Elevated LFTs 09/26/2016   Skin lesions 09/26/2016   Palpitations 09/26/2016   Allergic rhinitis 09/26/2016   Vitamin D deficiency 09/26/2016   Hereditary hemochromatosis ([place]) 10/29/2016   Iron overload 10/29/2016   Headache 06/01/2017   Right foot pain 06/01/2017   Renal cyst 06/01/2017   Encounter for general adult medical examination with abnormal findings 09/22/2018   Memory difficulty 09/22/2018   Tinnitus 09/19/2019   Elevated LDL cholesterol level 10/24/2019   Multiple nevi 09/22/2020   Resolved Ambulatory Problems    Diagnosis Date Noted   No Resolved Ambulatory Problems   Past Medical History:  Diagnosis Date   Allergy    Asthma    Hemochromatosis    Kidney stone    Palpitation     Family History  Problem Relation Age of Onset   Asthma Maternal Aunt    Hypertension Other        Parent   Stroke  Other        Grandparent   Mental illness Other        Grandparent   Arthritis Other        Grandparent   Hypertension Father     Social History   Socioeconomic History   Marital status: Married    Spouse name: Not on file   Number of children: Not on file   Years of education: Not on file   Highest education level: Not on file  Occupational History   Not on file  Tobacco Use   Smoking status: Never   Smokeless tobacco: Never  Vaping Use   Vaping Use: Never used  Substance and Sexual Activity   Alcohol use: No   Drug use: No   Sexual activity: Yes  Other Topics Concern   Not on file  Social History Narrative   Not on file   Social Determinants of Health   Financial Resource Strain: Not on file  Food Insecurity: Not on file  Transportation Needs: Not on file  Physical Activity: Not on file  Stress: Not on file  Social Connections: Not on file  Intimate Partner Violence: Not on file    ROS  General:  Negative for nexplained weight loss, fever Skin: Positive for new or changing mole, negative for sore that won't heal HEENT: Negative for trouble hearing, trouble seeing, ringing in ears, mouth sores, hoarseness, change in voice, dysphagia. CV:  Negative for chest pain, dyspnea, edema, palpitations Resp: Negative for cough, dyspnea, hemoptysis GI: Negative for nausea, vomiting, diarrhea, constipation, abdominal pain, melena, hematochezia. GU: Negative for dysuria, incontinence, urinary hesitance, hematuria, vaginal or penile discharge, polyuria, sexual difficulty, lumps in testicle or breasts MSK: Negative for muscle cramps or aches, joint pain or swelling Neuro: Negative for headaches, weakness, numbness, dizziness, passing out/fainting Psych: Negative for depression, anxiety, memory problems  Objective  Physical Exam Vitals:   09/22/20 0856  BP: 90/60  Pulse: 63  Temp: 98.3 F (36.8 C)  SpO2: 98%    BP Readings from Last 3 Encounters:  09/22/20 90/60   09/14/20 111/82  09/14/20 108/82   Wt Readings from Last 3 Encounters:  09/22/20 152 lb 3.2 oz (69 kg)  09/14/20 154 lb 7 oz (70.1 kg)  03/16/20 158 lb (71.7 kg)    Physical Exam Constitutional:      General: He is not in acute distress.    Appearance: He is not diaphoretic.  HENT:     Head: Normocephalic and atraumatic.   Eyes:     Conjunctiva/sclera: Conjunctivae normal.     Pupils: Pupils are equal, round, and reactive to light.  Neck:   Cardiovascular:     Rate and Rhythm: Normal rate and regular rhythm.     Heart sounds: Normal heart sounds.  Pulmonary:     Effort: Pulmonary effort is normal.     Breath sounds: Normal breath sounds.  Abdominal:     General: Bowel sounds are normal. There is no distension.     Palpations: Abdomen is soft.     Tenderness: There is no abdominal tenderness. There is no guarding or rebound.  Musculoskeletal:     Right lower leg: No edema.     Left lower leg: No edema.  Lymphadenopathy:     Cervical: No cervical adenopathy.  Skin:    General: Skin is warm and dry.  Neurological:     Mental Status: He is alert.  Psychiatric:        Mood and Affect: Mood normal.     Assessment/Plan:   Problem List Items Addressed This Visit     Elevated LDL cholesterol level   Relevant Orders   Basic Metabolic Panel (BMET)   Lipid panel   Encounter for general adult medical examination with abnormal findings - Primary    Physical exam completed.  Encouraged continued healthy diet and exercise.  He will get his flu vaccine through his job when it becomes available.  Lab work as outlined.       Multiple nevi    Discussed the nevus on his left temple generally appears benign.  Discussed the nevus on his left neck generally appears benign though does have a slight irregularity of the border.  I noted this could be related to him having nicked this when he was shaving though it does warrant evaluation with dermatology.  Referral will be  placed.       Relevant Orders   Ambulatory referral to Dermatology   Vitamin D deficiency   Relevant Orders   Vitamin D (25 hydroxy)    Return in about 1 year (around 09/22/2021) for CPE.  This visit occurred during the SARS-CoV-2 public health emergency.  Safety protocols were in place, including screening questions prior to the visit, additional usage of staff PPE, and extensive cleaning of exam room while observing appropriate contact time as indicated for disinfecting solutions.    [name], MD [facility] -  [employer]

## 2020-09-22 NOTE — Assessment & Plan Note (Signed)
Discussed the nevus on his left temple generally appears benign.  Discussed the nevus on his left neck generally appears benign though does have a slight irregularity of the border.  I noted this could be related to him having nicked this when he was shaving though it does warrant evaluation with dermatology.  Referral will be placed.

## 2020-09-22 NOTE — Assessment & Plan Note (Signed)
Physical exam completed.  Encouraged continued healthy diet and exercise.  He will get his flu vaccine through his job when it becomes available.  Lab work as outlined.

## 2020-09-22 NOTE — Patient Instructions (Signed)
Nice to see you. Please continue with healthy diet and exercise. Please get your flu vaccine when it becomes available through your work. We will get lab work today.

## 2020-09-30 ENCOUNTER — Other Ambulatory Visit: Payer: Self-pay | Admitting: Family Medicine

## 2020-09-30 DIAGNOSIS — E559 Vitamin D deficiency, unspecified: Secondary | ICD-10-CM

## 2020-11-04 DIAGNOSIS — H5000 Unspecified esotropia: Secondary | ICD-10-CM | POA: Diagnosis not present

## 2020-11-15 ENCOUNTER — Other Ambulatory Visit: Payer: Self-pay

## 2020-11-15 ENCOUNTER — Other Ambulatory Visit (INDEPENDENT_AMBULATORY_CARE_PROVIDER_SITE_OTHER): Payer: BC Managed Care – PPO

## 2020-11-15 DIAGNOSIS — E559 Vitamin D deficiency, unspecified: Secondary | ICD-10-CM

## 2020-11-15 LAB — VITAMIN D 25 HYDROXY (VIT D DEFICIENCY, FRACTURES): VITD: 18.02 ng/mL — ABNORMAL LOW (ref 30.00–100.00)

## 2020-11-18 ENCOUNTER — Other Ambulatory Visit: Payer: Self-pay | Admitting: Family Medicine

## 2020-11-18 DIAGNOSIS — E559 Vitamin D deficiency, unspecified: Secondary | ICD-10-CM

## 2020-11-18 MED ORDER — VITAMIN D (ERGOCALCIFEROL) 1.25 MG (50000 UNIT) PO CAPS
50000.0000 [IU] | ORAL_CAPSULE | ORAL | 0 refills | Status: DC
Start: 2020-11-18 — End: 2021-03-16

## 2020-12-07 ENCOUNTER — Ambulatory Visit: Payer: BC Managed Care – PPO | Admitting: Dermatology

## 2021-01-21 DIAGNOSIS — Z0189 Encounter for other specified special examinations: Secondary | ICD-10-CM | POA: Diagnosis not present

## 2021-01-25 DIAGNOSIS — Z713 Dietary counseling and surveillance: Secondary | ICD-10-CM | POA: Diagnosis not present

## 2021-01-25 DIAGNOSIS — Z043 Encounter for examination and observation following other accident: Secondary | ICD-10-CM | POA: Diagnosis not present

## 2021-01-25 DIAGNOSIS — R748 Abnormal levels of other serum enzymes: Secondary | ICD-10-CM | POA: Diagnosis not present

## 2021-02-09 ENCOUNTER — Ambulatory Visit: Payer: BC Managed Care – PPO | Admitting: Dermatology

## 2021-02-09 ENCOUNTER — Other Ambulatory Visit: Payer: Self-pay

## 2021-02-09 DIAGNOSIS — D1800 Hemangioma unspecified site: Secondary | ICD-10-CM

## 2021-02-09 DIAGNOSIS — Z1283 Encounter for screening for malignant neoplasm of skin: Secondary | ICD-10-CM

## 2021-02-09 DIAGNOSIS — D492 Neoplasm of unspecified behavior of bone, soft tissue, and skin: Secondary | ICD-10-CM

## 2021-02-09 DIAGNOSIS — L720 Epidermal cyst: Secondary | ICD-10-CM | POA: Diagnosis not present

## 2021-02-09 DIAGNOSIS — L858 Other specified epidermal thickening: Secondary | ICD-10-CM

## 2021-02-09 DIAGNOSIS — L814 Other melanin hyperpigmentation: Secondary | ICD-10-CM

## 2021-02-09 DIAGNOSIS — L820 Inflamed seborrheic keratosis: Secondary | ICD-10-CM | POA: Diagnosis not present

## 2021-02-09 DIAGNOSIS — D2262 Melanocytic nevi of left upper limb, including shoulder: Secondary | ICD-10-CM

## 2021-02-09 DIAGNOSIS — L821 Other seborrheic keratosis: Secondary | ICD-10-CM

## 2021-02-09 DIAGNOSIS — L578 Other skin changes due to chronic exposure to nonionizing radiation: Secondary | ICD-10-CM | POA: Diagnosis not present

## 2021-02-09 DIAGNOSIS — D229 Melanocytic nevi, unspecified: Secondary | ICD-10-CM

## 2021-02-09 NOTE — Progress Notes (Signed)
New Patient Visit  Subjective  Cole Lane is a 45 y.o. male who presents for the following: Total body skin exam (No hx of skin ca, no fhx of skin ca/PCP concerned about an irregular mole under L ear).  New patient referral from Dr. [name].  The patient presents for Total-Body Skin Exam (TBSE) for skin cancer screening and mole check.  The patient has spots, moles and lesions to be evaluated, some may be new or changing and the patient has concerns that these could be cancer.   The following portions of the chart were reviewed this encounter and updated as appropriate:   Tobacco   Allergies   Meds   Problems   Med Hx   Surg Hx   Fam Hx       Review of Systems:  No other skin or systemic complaints except as noted in HPI or Assessment and Plan.  Objective  Well appearing patient in no apparent distress; mood and affect are within normal limits.  A full examination was performed including scalp, head, eyes, ears, nose, lips, neck, chest, axillae, abdomen, back, buttocks, bilateral upper extremities, bilateral lower extremities, hands, feet, fingers, toes, fingernails, and toenails. All findings within normal limits unless otherwise noted below.  L cheek 0.3cm cystic pap  L temple x 1, L infra auricular x 1, L supra clavicular x 1, R upper back x 1, R medial thigh near the groin x 1 (5) Stuck on waxy paps with erythema   L bicep 0.8cm brown pap  bil triceps Tiny follicular keratotic papules.    Assessment & Plan   Lentigines - Scattered tan macules - Due to sun exposure - Benign-appearing, observe - Recommend daily broad spectrum sunscreen SPF 30+ to sun-exposed areas, reapply every 2 hours as needed. - Call for any changes  Seborrheic Keratoses - Stuck-on, waxy, tan-brown papules and/or plaques  - Benign-appearing - Discussed benign etiology and prognosis. - Observe - Call for any changes  Melanocytic Nevi - Tan-brown and/or pink-flesh-colored  symmetric macules and papules - Benign appearing on exam today - Observation - Call clinic for new or changing moles - Recommend daily use of broad spectrum spf 30+ sunscreen to sun-exposed areas.   Hemangiomas - Red papules - Discussed benign nature - Observe - Call for any changes  Actinic Damage - Chronic condition, secondary to cumulative UV/sun exposure - diffuse scaly erythematous macules with underlying dyspigmentation - Recommend daily broad spectrum sunscreen SPF 30+ to sun-exposed areas, reapply every 2 hours as needed.  - Staying in the shade or wearing long sleeves, sun glasses (UVA+UVB protection) and wide brim hats (4-inch brim around the entire circumference of the hat) are also recommended for sun protection.  - Call for new or changing lesions.  Skin cancer screening performed today.  Epidermal cyst L cheek Benign-appearing. Exam most consistent with an epidermal inclusion cyst. Discussed that a cyst is a benign growth that can grow over time and sometimes get irritated or inflamed. Recommend observation if it is not bothersome. Discussed option of surgical excision to remove it if it is growing, symptomatic, or other changes noted. Please call for new or changing lesions so they can be evaluated.  Inflamed seborrheic keratosis (5) L temple x 1, L infra auricular x 1, L supra clavicular x 1, R upper back x 1, R medial thigh near the groin x 1  Destruction of lesion - L temple x 1, L infra auricular x 1, L supra clavicular x 1,  R upper back x 1, R medial thigh near the groin x 1 Complexity: simple   Destruction method: cryotherapy   Informed consent: discussed and consent obtained   Timeout:  patient name, date of birth, surgical site, and procedure verified Lesion destroyed using liquid nitrogen: Yes   Region frozen until ice ball extended beyond lesion: Yes   Outcome: patient tolerated procedure well with no complications   Post-procedure details: wound care  instructions given    Neoplasm of skin L bicep Epidermal / dermal shaving  Lesion diameter (cm):  0.8 Informed consent: discussed and consent obtained   Timeout: patient name, date of birth, surgical site, and procedure verified   Procedure prep:  Patient was prepped and draped in usual sterile fashion Prep type:  Isopropyl alcohol Anesthesia: the lesion was anesthetized in a standard fashion   Anesthetic:  1% lidocaine w/ epinephrine 1-100,000 buffered w/ 8.4% NaHCO3 Instrument used: flexible razor blade   Hemostasis achieved with: pressure, aluminum chloride and electrodesiccation   Outcome: patient tolerated procedure well   Post-procedure details: sterile dressing applied and wound care instructions given   Dressing type: bandage and petrolatum    Specimen 1 - Surgical pathology Differential Diagnosis: D48.5 ISK vs Nevus vs other Check Margins: yes 0.8cm brown pap  Keratosis pilaris bil triceps Benign, observe.  Treatable but not curable.  It is genetic. Consider AmLactin rapid relief moisturizer  Return if symptoms worsen or fail to improve.  I, Othelia Pulling, RMA, am acting as scribe for Sarina Ser, MD . Documentation: I have reviewed the above documentation for accuracy and completeness, and I agree with the above.  Sarina Ser, MD

## 2021-02-09 NOTE — Patient Instructions (Addendum)

## 2021-02-16 ENCOUNTER — Telehealth: Payer: Self-pay

## 2021-02-16 NOTE — Telephone Encounter (Signed)
Advised patient of results/hd  

## 2021-02-16 NOTE — Telephone Encounter (Signed)
-----   Message from Ralene Bathe, MD sent at 02/14/2021  8:05 PM EST ----- Diagnosis Skin , L bicep MELANOCYTIC NEVUS, INTRADERMAL TYPE, IRRITATED, DEEP MARGIN INVOLVED  Benign irritated mole No further treatment needed

## 2021-02-20 ENCOUNTER — Encounter: Payer: Self-pay | Admitting: Dermatology

## 2021-03-08 ENCOUNTER — Other Ambulatory Visit: Payer: Self-pay | Admitting: *Deleted

## 2021-03-14 ENCOUNTER — Other Ambulatory Visit: Payer: Self-pay

## 2021-03-14 ENCOUNTER — Inpatient Hospital Stay: Payer: BC Managed Care – PPO | Attending: Oncology

## 2021-03-14 LAB — CBC WITH DIFFERENTIAL/PLATELET
Abs Immature Granulocytes: 0.01 10*3/uL (ref 0.00–0.07)
Basophils Absolute: 0.1 10*3/uL (ref 0.0–0.1)
Basophils Relative: 1 %
Eosinophils Absolute: 0.1 10*3/uL (ref 0.0–0.5)
Eosinophils Relative: 1 %
HCT: 43.2 % (ref 39.0–52.0)
Hemoglobin: 14.3 g/dL (ref 13.0–17.0)
Immature Granulocytes: 0 %
Lymphocytes Relative: 30 %
Lymphs Abs: 1.7 10*3/uL (ref 0.7–4.0)
MCH: 30.3 pg (ref 26.0–34.0)
MCHC: 33.1 g/dL (ref 30.0–36.0)
MCV: 91.5 fL (ref 80.0–100.0)
Monocytes Absolute: 0.4 10*3/uL (ref 0.1–1.0)
Monocytes Relative: 7 %
Neutro Abs: 3.5 10*3/uL (ref 1.7–7.7)
Neutrophils Relative %: 61 %
Platelets: 200 10*3/uL (ref 150–400)
RBC: 4.72 MIL/uL (ref 4.22–5.81)
RDW: 11.9 % (ref 11.5–15.5)
WBC: 5.7 10*3/uL (ref 4.0–10.5)
nRBC: 0 % (ref 0.0–0.2)

## 2021-03-14 LAB — FERRITIN: Ferritin: 86 ng/mL (ref 24–336)

## 2021-03-14 LAB — IRON AND TIBC
Iron: 149 ug/dL (ref 45–182)
Saturation Ratios: 47 % — ABNORMAL HIGH (ref 17.9–39.5)
TIBC: 319 ug/dL (ref 250–450)
UIBC: 170 ug/dL

## 2021-03-16 ENCOUNTER — Other Ambulatory Visit: Payer: Self-pay

## 2021-03-16 ENCOUNTER — Encounter: Payer: Self-pay | Admitting: Oncology

## 2021-03-16 ENCOUNTER — Inpatient Hospital Stay: Payer: BC Managed Care – PPO | Admitting: Oncology

## 2021-03-16 ENCOUNTER — Inpatient Hospital Stay: Payer: BC Managed Care – PPO

## 2021-03-16 NOTE — Patient Instructions (Signed)

## 2021-03-16 NOTE — Progress Notes (Signed)
For therapeutic phlebotomy patient had 250 ml of blood removed via left AC with #20 g angiocath. Pt tolerated procedure well. Accepted a beverage and peanut butter crackers. Discharged to home after observation period. Reports feeling well.

## 2021-03-16 NOTE — Progress Notes (Signed)
Hematology/Oncology Follow Up Visit [phone] [phone]   Patient Care Team: [name], MD as PCP - General (Family Medicine)  Referring Physician Dr.[email or web address]  REASON FOR VISIT Follow up for treatment of hemochromatosis and iron overload  PERTINENT HEMATOLOGY HISTORY Cole Lane 46 y.o.  male with past medical history listed as per low who was referred by Dr. 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 Patient denies any family history of hemochromatosis. He is eating history his health state. He feels well at baseline except sometimes if he stands up pretty fast for dizziness. He has been following up with primary care physician and has been noticed that his liver function tests has been mildly elevated. Additional workup showed high ferritin 353, also high transferrin saturation of 70%. 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 He is married and has no children yet.  He follows up with GI Dr.Anna and has ongoing work up. US abdomen was suggested and has not been done yet.  # Discussed about avoiding alcohol, and uncooked seafood.  #Discussed about screening first degree relative for hereditary hemochromatosis.   INTERVAL HISTORY Cole Lane is a 46 y.o. 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 Patient reports feeling well.  No recent new diagnosis, infection.  Work is busy.    Review of Systems  Constitutional:  Negative for appetite change, chills, fatigue, fever and unexpected weight change.  HENT:   Negative for hearing loss and voice change.   Eyes:  Negative for eye problems and icterus.  Respiratory:  Negative for chest tightness, cough and shortness of breath.   Cardiovascular:  Negative for chest pain and leg  swelling.  Gastrointestinal:  Negative for abdominal distention and abdominal pain.  Endocrine: Negative for hot flashes.  Genitourinary:  Negative for difficulty urinating, dysuria and frequency.   Musculoskeletal:  Negative for arthralgias.  Skin:  Negative for itching and rash.  Neurological:  Negative for light-headedness and numbness.  Hematological:  Negative for adenopathy. Does not bruise/bleed easily.  Psychiatric/Behavioral:  Negative for confusion.    MEDICAL HISTORY:  Past Medical History:  Diagnosis Date   Allergic rhinitis    Allergy    Asthma    Hemochromatosis    Kidney stone    Palpitation     SURGICAL HISTORY: Past Surgical History:  Procedure Laterality Date   DERMOID CYST  EXCISION      SOCIAL HISTORY: Social History   Socioeconomic History   Marital status: Married    Spouse name: Not on file   Number of children: Not on file   Years of education: Not on file   Highest education level: Not on file  Occupational History   Not on file  Tobacco Use   Smoking status: Never   Smokeless tobacco: Never  Vaping Use   Vaping Use: Never used  Substance and Sexual Activity   Alcohol use: No   Drug use: No   Sexual activity: Yes  Other Topics Concern   Not on file  Social History Narrative   Not on file   Social Determinants of Health   Financial Resource Strain: Not on file  Food Insecurity: Not on file  Transportation Needs: Not on file  Physical Activity: Not on file  Stress: Not on file  Social Connections: Not on file  Intimate Partner Violence: Not on file  FAMILY HISTORY: Family History  Problem Relation Age of Onset   Asthma Maternal Aunt    Hypertension Other        Parent   Stroke Other        Grandparent   Mental illness Other        Grandparent   Arthritis Other        Grandparent   Hypertension Father     ALLERGIES:  is allergic to penicillin g.  MEDICATIONS:  Current Outpatient Medications  Medication Sig  Dispense Refill   cholecalciferol (VITAMIN D3) 25 MCG (1000 UNIT) tablet Take 1,000 Units by mouth daily. OVC     ibuprofen (ADVIL) 800 MG tablet Take 1 tablet (800 mg total) by mouth every 8 (eight) hours as needed. 21 tablet 0   Vitamin D, Ergocalciferol, (DRISDOL) 1.25 MG (50000 UNIT) CAPS capsule Take 1 capsule (50,000 Units total) by mouth every 7 (seven) days. 8 capsule 0   No current facility-administered medications for this visit.      Marland Kitchen  PHYSICAL EXAMINATION: ECOG PERFORMANCE STATUS: 0 - Asymptomatic Vitals:   03/16/21 1416  BP: 124/78  Pulse: 61  Temp: 97.6 F (36.4 C)   Filed Weights   03/16/21 1416  Weight: 157 lb (71.2 kg)   Physical Exam Constitutional:      General: He is not in acute distress.    Appearance: He is not diaphoretic.  HENT:     Head: Normocephalic and atraumatic.     Nose: Nose normal.     Mouth/Throat:     Pharynx: No oropharyngeal exudate.  Eyes:     General: No scleral icterus.       Left eye: No discharge.     Pupils: Pupils are equal, round, and reactive to light.  Neck:     Vascular: No JVD.  Cardiovascular:     Rate and Rhythm: Normal rate and regular rhythm.     Heart sounds: Normal heart sounds. No murmur heard. Pulmonary:     Effort: Pulmonary effort is normal. No respiratory distress.     Breath sounds: Normal breath sounds. No wheezing or rales.  Chest:     Chest wall: No tenderness.  Abdominal:     General: Bowel sounds are normal. There is no distension.     Palpations: Abdomen is soft. There is no mass.     Tenderness: There is no abdominal tenderness. There is no rebound.  Musculoskeletal:        General: No tenderness. Normal range of motion.     Cervical back: Normal range of motion and neck supple.  Lymphadenopathy:     Cervical: No cervical adenopathy.  Skin:    General: Skin is warm and dry.     Findings: No erythema or rash.  Neurological:     Mental Status: He is alert and oriented to person, place, and  time.     Cranial Nerves: No cranial nerve deficit.     Motor: No abnormal muscle tone.     Coordination: Coordination normal.  Psychiatric:        Mood and Affect: Mood and affect normal.   LABORATORY DATA:  I have reviewed the data as listed Lab Results  Component Value Date   WBC 5.7 03/14/2021   HGB 14.3 03/14/2021   HCT 43.2 03/14/2021   MCV 91.5 03/14/2021   PLT 200 03/14/2021   Recent Labs    09/09/20 1335 09/22/20 0929  NA  --  138  K  --  3.9  CL  --  102  CO2  --  26  GLUCOSE  --  82  BUN  --  11  CREATININE  --  0.82  CALCIUM  --  9.3  PROT 7.5  --   ALBUMIN 4.5  --   AST 41  --   ALT 23  --   ALKPHOS 78  --   BILITOT 0.6  --   BILIDIR <0.1  --   IBILI NOT CALCULATED  --     Iron/TIBC/Ferritin/ %Sat    Component Value Date/Time   IRON 149 03/14/2021 1356   TIBC 319 03/14/2021 1356   FERRITIN 86 03/14/2021 1356   IRONPCTSAT 47 (H) 03/14/2021 1356   IRONPCTSAT 70 (H) 09/26/2016 1115   Hepatitis Panel all negative.  DNA Mutation Analysis                          RESULT: COMPOUND HETEROZYGOUS FOR THE C282Y AND H63D MUTATIONS   RADIOGRAPHIC STUDIES: I have personally reviewed the radiological images as listed and agreed with the findings in the report. 02/08/2017 abdomen ultrasound showed no focal lesion in the liver.  Within normal limits in parenchymal echogenicity.     Right renal cyst.          ASSESSMENT & PLAN:  1. Hereditary hemochromatosis ([place])   2. 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 Labs are reviewed and discussed with patient. Ferritin is 86, iron saturation is 47. Recommend to proceed with phlebotomy today.     #History of transaminitis.  LFT was not checked at this visit.  Last ultrasound was in 2018.  I will obtain ultrasound right upper quadrant prior to next visit.  Check LFTs at the next visit.  All questions were answered. The patient knows to call the clinic  with any problems questions or concerns. Orders Placed This Encounter  Procedures   US Abdomen Limited RUQ (LIVER/GB)    Standing Status:   Future    Standing Expiration Date:   03/16/2022    Order Specific Question:   Reason for Exam (SYMPTOM  OR DIAGNOSIS REQUIRED)    Answer:   hemochromatosis    Order Specific Question:   Preferred imaging location?    Answer:   [facility]     Return of visit: CBC,  iron TIBC ferritin in 6 months, MD +/- phlebotomy.   Earlie Server, MD, PhD 03/16/2021

## 2021-09-12 ENCOUNTER — Other Ambulatory Visit: Payer: Self-pay

## 2021-09-13 ENCOUNTER — Inpatient Hospital Stay: Payer: BC Managed Care – PPO | Attending: Oncology

## 2021-09-13 ENCOUNTER — Ambulatory Visit
Admission: RE | Admit: 2021-09-13 | Discharge: 2021-09-13 | Disposition: A | Discharge status: Home / Self Care | Payer: BC Managed Care – PPO | Source: Ambulatory Visit | Attending: Oncology | Admitting: Oncology

## 2021-09-13 DIAGNOSIS — K824 Cholesterolosis of gallbladder: Secondary | ICD-10-CM | POA: Diagnosis not present

## 2021-09-13 LAB — COMPREHENSIVE METABOLIC PANEL
ALT: 31 U/L (ref 0–44)
AST: 44 U/L — ABNORMAL HIGH (ref 15–41)
Albumin: 4.5 g/dL (ref 3.5–5.0)
Alkaline Phosphatase: 64 U/L (ref 38–126)
Anion gap: 8 (ref 5–15)
BUN: 12 mg/dL (ref 6–20)
CO2: 27 mmol/L (ref 22–32)
Calcium: 9.3 mg/dL (ref 8.9–10.3)
Chloride: 104 mmol/L (ref 98–111)
Creatinine, Ser: 0.9 mg/dL (ref 0.61–1.24)
GFR, Estimated: >60 mL/min (ref >60)
Glucose, Bld: 96 mg/dL (ref 70–99)
Potassium: 3.7 mmol/L (ref 3.5–5.1)
Sodium: 139 mmol/L (ref 135–145)
Total Bilirubin: 1 mg/dL (ref 0.3–1.2)
Total Protein: 7.9 g/dL (ref 6.5–8.1)

## 2021-09-13 LAB — CBC WITH DIFFERENTIAL/PLATELET
Abs Immature Granulocytes: 0.02 K/uL (ref 0.00–0.07)
Basophils Absolute: 0 K/uL (ref 0.0–0.1)
Basophils Relative: 1 %
Eosinophils Absolute: 0.1 K/uL (ref 0.0–0.5)
Eosinophils Relative: 2 %
HCT: 41.5 % (ref 39.0–52.0)
Hemoglobin: 13.9 g/dL (ref 13.0–17.0)
Immature Granulocytes: 0 %
Lymphocytes Relative: 35 %
Lymphs Abs: 2 K/uL (ref 0.7–4.0)
MCH: 30.5 pg (ref 26.0–34.0)
MCHC: 33.5 g/dL (ref 30.0–36.0)
MCV: 91 fL (ref 80.0–100.0)
Monocytes Absolute: 0.5 K/uL (ref 0.1–1.0)
Monocytes Relative: 9 %
Neutro Abs: 3.1 K/uL (ref 1.7–7.7)
Neutrophils Relative %: 53 %
Platelets: 199 K/uL (ref 150–400)
RBC: 4.56 MIL/uL (ref 4.22–5.81)
RDW: 11.8 % (ref 11.5–15.5)
WBC: 5.8 K/uL (ref 4.0–10.5)
nRBC: 0 % (ref 0.0–0.2)

## 2021-09-13 LAB — IRON AND TIBC
Iron: 178 ug/dL (ref 45–182)
Saturation Ratios: 56 % — ABNORMAL HIGH (ref 17.9–39.5)
TIBC: 319 ug/dL (ref 250–450)
UIBC: 141 ug/dL

## 2021-09-13 LAB — FERRITIN: Ferritin: 95 ng/mL (ref 24–336)

## 2021-09-15 ENCOUNTER — Encounter: Payer: Self-pay | Admitting: Oncology

## 2021-09-15 ENCOUNTER — Inpatient Hospital Stay (HOSPITAL_BASED_OUTPATIENT_CLINIC_OR_DEPARTMENT_OTHER): Payer: BC Managed Care – PPO | Admitting: Oncology

## 2021-09-15 ENCOUNTER — Inpatient Hospital Stay: Payer: BC Managed Care – PPO

## 2021-09-15 DIAGNOSIS — R7401 Elevation of levels of liver transaminase levels: Secondary | ICD-10-CM | POA: Diagnosis not present

## 2021-09-15 NOTE — Progress Notes (Signed)
Hematology/Oncology Progress note [phone] [phone]      Patient Care Team: [name], MD as PCP - General (Family Medicine)  Referring Physician Dr.[email or web address]  REASON FOR VISIT Follow up for treatment of hemochromatosis and iron overload  PERTINENT HEMATOLOGY HISTORY Cole Lane 46 y.o.  male with past medical history listed as per low who was referred by Dr. 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 Patient denies any family history of hemochromatosis. He is eating history his health state. He feels well at baseline except sometimes if he stands up pretty fast for dizziness. He has been following up with primary care physician and has been noticed that his liver function tests has been mildly elevated. Additional workup showed high ferritin 353, also high transferrin saturation of 70%. 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 He is married and has no children yet.  He follows up with GI Dr.Anna and has ongoing work up. US abdomen was suggested and has not been done yet.  # Discussed about avoiding alcohol, and uncooked seafood.  #Discussed about screening first degree relative for hereditary hemochromatosis.   INTERVAL HISTORY Cole Lane is a 46 y.o. 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 Patient reports feeling well.   Patient reports possibly dehydrated during his trip. Also recently he experienced tongue pain/tingling after temporary crown was placed.  Temporary crown has been replaced with permanent crown and pain has improved.  No other inflammation/infection episodes recently.  He does not drink alcohol.    Review of Systems  Constitutional:  Negative for appetite change, chills, fatigue, fever and unexpected  weight change.  HENT:   Negative for hearing loss and voice change.   Eyes:  Negative for eye problems and icterus.  Respiratory:  Negative for chest tightness, cough and shortness of breath.   Cardiovascular:  Negative for chest pain and leg swelling.  Gastrointestinal:  Negative for abdominal distention and abdominal pain.  Endocrine: Negative for hot flashes.  Genitourinary:  Negative for difficulty urinating, dysuria and frequency.   Musculoskeletal:  Negative for arthralgias.  Skin:  Negative for itching and rash.  Neurological:  Negative for light-headedness and numbness.  Hematological:  Negative for adenopathy. Does not bruise/bleed easily.  Psychiatric/Behavioral:  Negative for confusion.     MEDICAL HISTORY:  Past Medical History:  Diagnosis Date   Allergic rhinitis    Allergy    Asthma    Hemochromatosis    Kidney stone    Palpitation     SURGICAL HISTORY: Past Surgical History:  Procedure Laterality Date   DERMOID CYST  EXCISION      SOCIAL HISTORY: Social History   Socioeconomic History   Marital status: Married    Spouse name: Not on file   Number of children: Not on file   Years of education: Not on file   Highest education level: Not on file  Occupational History   Not on file  Tobacco Use   Smoking status: Never   Smokeless tobacco: Never  Vaping Use   Vaping Use: Never used  Substance and Sexual Activity   Alcohol use: No   Drug use: No   Sexual activity: Yes  Other Topics Concern   Not on file  Social History Narrative   Not on file   Social Determinants of Health   Financial Resource Strain: Not on file  Food Insecurity: Not on file  Transportation Needs: Not on file  Physical Activity: Not on file  Stress: Not on file  Social Connections: Not on file  Intimate Partner Violence: Not on file    FAMILY HISTORY: Family History  Problem Relation Age of Onset   Asthma Maternal Aunt    Hypertension Other        Parent   Stroke  Other        Grandparent   Mental illness Other        Grandparent   Arthritis Other        Grandparent   Hypertension Father     ALLERGIES:  is allergic to penicillin g.  MEDICATIONS:  Current Outpatient Medications  Medication Sig Dispense Refill   cholecalciferol (VITAMIN D3) 25 MCG (1000 UNIT) tablet Take 1,000 Units by mouth daily. OVC     ibuprofen (ADVIL) 800 MG tablet Take 1 tablet (800 mg total) by mouth every 8 (eight) hours as needed. 21 tablet 0   No current facility-administered medications for this visit.      Marland Kitchen  PHYSICAL EXAMINATION: ECOG PERFORMANCE STATUS: 0 - Asymptomatic Vitals:   09/15/21 1330  BP: 119/85  Pulse: 68  Temp: (!) 96.8 F (36 C)   Filed Weights   09/15/21 1330  Weight: 153 lb (69.4 kg)   Physical Exam Constitutional:      General: He is not in acute distress.    Appearance: He is not diaphoretic.  HENT:     Head: Normocephalic and atraumatic.     Nose: Nose normal.     Mouth/Throat:     Pharynx: No oropharyngeal exudate.  Eyes:     General: No scleral icterus.       Left eye: No discharge.     Pupils: Pupils are equal, round, and reactive to light.  Neck:     Vascular: No JVD.  Cardiovascular:     Rate and Rhythm: Normal rate and regular rhythm.     Heart sounds: Normal heart sounds. No murmur heard. Pulmonary:     Effort: Pulmonary effort is normal. No respiratory distress.     Breath sounds: Normal breath sounds. No wheezing or rales.  Chest:     Chest wall: No tenderness.  Abdominal:     General: Bowel sounds are normal. There is no distension.     Palpations: Abdomen is soft. There is no mass.     Tenderness: There is no abdominal tenderness. There is no rebound.  Musculoskeletal:        General: No tenderness. Normal range of motion.     Cervical back: Normal range of motion and neck supple.  Lymphadenopathy:     Cervical: No cervical adenopathy.  Skin:    General: Skin is warm and dry.     Findings: No  erythema or rash.  Neurological:     Mental Status: He is alert and oriented to person, place, and time.     Cranial Nerves: No cranial nerve deficit.     Motor: No abnormal muscle tone.     Coordination: Coordination normal.  Psychiatric:        Mood and Affect: Mood and affect normal.    LABORATORY DATA:  I have reviewed the data as listed Lab Results  Component Value Date   WBC 5.8 09/13/2021   HGB 13.9 09/13/2021   HCT 41.5 09/13/2021   MCV 91.0 09/13/2021   PLT 199 09/13/2021   Recent Labs  09/22/20 0929 09/13/21 0831  NA 138 139  K 3.9 3.7  CL 102 104  CO2 26 27  GLUCOSE 82 96  BUN 11 12  CREATININE 0.82 0.90  CALCIUM 9.3 9.3  GFRNONAA  --  >60  PROT  --  7.9  ALBUMIN  --  4.5  AST  --  44*  ALT  --  31  ALKPHOS  --  64  BILITOT  --  1.0    Iron/TIBC/Ferritin/ %Sat    Component Value Date/Time   IRON 178 09/13/2021 0831   TIBC 319 09/13/2021 0831   FERRITIN 95 09/13/2021 0831   IRONPCTSAT 56 (H) 09/13/2021 0831   IRONPCTSAT 70 (H) 09/26/2016 1115   Hepatitis Panel all negative.  DNA Mutation Analysis                          RESULT: COMPOUND HETEROZYGOUS FOR THE C282Y AND H63D MUTATIONS   RADIOGRAPHIC STUDIES: I have personally reviewed the radiological images as listed and agreed with the findings in the report. 02/08/2017 abdomen ultrasound showed no focal lesion in the liver.  Within normal limits in parenchymal echogenicity.     Right renal cyst.          ASSESSMENT & PLAN:  1. Hereditary hemochromatosis ([place])   2. 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 Ferritin is 95, iron saturation is 56.  Ultrasound abdomen negative for liver cirrhosis.  LFTs stable. Discussed with patient that given that his ferritin has been stable, isolated iron saturation does not need urgent phlebotomy treatments.  Continue observation and expectant management.  He agrees with the plan We discussed  about increase the interval to annually.  He agrees.  All questions were answered. The patient knows to call the clinic with any problems questions or concerns. Orders Placed This Encounter  Procedures   CBC with Differential/Platelet    Standing Status:   Future    Standing Expiration Date:   09/16/2022   Ferritin    Standing Status:   Future    Standing Expiration Date:   09/16/2022   Iron and TIBC    Standing Status:   Future    Standing Expiration Date:   09/16/2022     Return of visit: 1 year CBC,  iron TIBC ferritin , MD +/- phlebotomy.   Earlie Server, MD, PhD 09/15/2021

## 2021-09-23 ENCOUNTER — Ambulatory Visit (INDEPENDENT_AMBULATORY_CARE_PROVIDER_SITE_OTHER): Payer: BC Managed Care – PPO | Admitting: Family Medicine

## 2021-09-23 ENCOUNTER — Encounter: Payer: Self-pay | Admitting: Family Medicine

## 2021-09-23 VITALS — BP 90/60 | HR 75 | Temp 98.3°F | Ht 72.0 in | Wt 155.0 lb

## 2021-09-23 DIAGNOSIS — R682 Dry mouth, unspecified: Secondary | ICD-10-CM

## 2021-09-23 DIAGNOSIS — M545 Low back pain, unspecified: Secondary | ICD-10-CM | POA: Insufficient documentation

## 2021-09-23 DIAGNOSIS — Z0001 Encounter for general adult medical examination with abnormal findings: Secondary | ICD-10-CM | POA: Diagnosis not present

## 2021-09-23 DIAGNOSIS — Z13220 Encounter for screening for lipoid disorders: Secondary | ICD-10-CM | POA: Diagnosis not present

## 2021-09-23 DIAGNOSIS — E559 Vitamin D deficiency, unspecified: Secondary | ICD-10-CM | POA: Diagnosis not present

## 2021-09-23 HISTORY — DX: Low back pain, unspecified: M54.50

## 2021-09-23 HISTORY — DX: Dry mouth, unspecified: R68.2

## 2021-09-23 LAB — LIPID PANEL
Cholesterol: 181 mg/dL (ref 0–200)
HDL: 47.8 mg/dL (ref >39.00)
LDL Cholesterol: 117 mg/dL — ABNORMAL HIGH (ref 0–99)
NonHDL: 133.18
Total CHOL/HDL Ratio: 4
Triglycerides: 79 mg/dL (ref 0.0–149.0)
VLDL: 15.8 mg/dL (ref 0.0–40.0)

## 2021-09-23 LAB — VITAMIN D 25 HYDROXY (VIT D DEFICIENCY, FRACTURES): VITD: 16.24 ng/mL — ABNORMAL LOW (ref 30.00–100.00)

## 2021-09-23 NOTE — Assessment & Plan Note (Addendum)
Physical exam completed.  Encouraged getting back to exercising.  He will continue healthy diet.  Discussed updated COVID booster and advised he could get this at the pharmacy if he is willing to get this.  Recent lab work reviewed.  Labs as outlined.

## 2021-09-23 NOTE — Patient Instructions (Signed)
Nice to see you. Please get back to exercising. Please do the exercises for your back as well.  You should do these 2-3 times a week.Back Exercises The following exercises strengthen the muscles that help to support the trunk (torso) and back. They also help to keep the lower back flexible. Doing these exercises can help to prevent or lessen existing low back pain. If you have back pain or discomfort, try doing these exercises 2-3 times each day or as told by your health care provider. As your pain improves, do them once each day, but increase the number of times that you repeat the steps for each exercise (do more repetitions). To prevent the recurrence of back pain, continue to do these exercises once each day or as told by your health care provider. Do exercises exactly as told by your health care provider and adjust them as directed. It is normal to feel mild stretching, pulling, tightness, or discomfort as you do these exercises, but you should stop right away if you feel sudden pain or your pain gets worse. Exercises Single knee to chest Repeat these steps 3-5 times for each leg: Lie on your back on a firm bed or the floor with your legs extended. Bring one knee to your chest. Your other leg should stay extended and in contact with the floor. Hold your knee in place by grabbing your knee or thigh with both hands and hold. Pull on your knee until you feel a gentle stretch in your lower back or buttocks. Hold the stretch for 10-30 seconds. Slowly release and straighten your leg.  Pelvic tilt Repeat these steps 5-10 times: Lie on your back on a firm bed or the floor with your legs extended. Bend your knees so they are pointing toward the ceiling and your feet are flat on the floor. Tighten your lower abdominal muscles to press your lower back against the floor. This motion will tilt your pelvis so your tailbone points up toward the ceiling instead of pointing to your feet or the floor. With  gentle tension and even breathing, hold this position for 5-10 seconds.  Cat-cow Repeat these steps until your lower back becomes more flexible: Get into a hands-and-knees position on a firm bed or the floor. Keep your hands under your shoulders, and keep your knees under your hips. You may place padding under your knees for comfort. Let your head hang down toward your chest. Contract your abdominal muscles and point your tailbone toward the floor so your lower back becomes rounded like the back of a cat. Hold this position for 5 seconds. Slowly lift your head, let your abdominal muscles relax, and point your tailbone up toward the ceiling so your back forms a sagging arch like the back of a cow. Hold this position for 5 seconds.  Press-ups Repeat these steps 5-10 times: Lie on your abdomen (face-down) on a firm bed or the floor. Place your palms near your head, about shoulder-width apart. Keeping your back as relaxed as possible and keeping your hips on the floor, slowly straighten your arms to raise the top half of your body and lift your shoulders. Do not use your back muscles to raise your upper torso. You may adjust the placement of your hands to make yourself more comfortable. Hold this position for 5 seconds while you keep your back relaxed. Slowly return to lying flat on the floor.  Bridges Repeat these steps 10 times: Lie on your back on a firm bed  or the floor. Bend your knees so they are pointing toward the ceiling and your feet are flat on the floor. Your arms should be flat at your sides, next to your body. Tighten your buttocks muscles and lift your buttocks off the floor until your waist is at almost the same height as your knees. You should feel the muscles working in your buttocks and the back of your thighs. If you do not feel these muscles, slide your feet 1-2 inches (2.5-5 cm) farther away from your buttocks. Hold this position for 3-5 seconds. Slowly lower your hips to  the starting position, and allow your buttocks muscles to relax completely. If this exercise is too easy, try doing it with your arms crossed over your chest.  Back lifts Repeat these steps 5-10 times: Lie on your abdomen (face-down) with your arms at your sides, and rest your forehead on the floor. Tighten the muscles in your legs and your buttocks. Slowly lift your chest off the floor while you keep your hips pressed to the floor. Keep the back of your head in line with the curve in your back. Your eyes should be looking at the floor. Hold this position for 3-5 seconds. Slowly return to your starting position.  Contact a health care provider if: Your back pain or discomfort gets much worse when you do an exercise. Your worsening back pain or discomfort does not lessen within 2 hours after you exercise. If you have any of these problems, stop doing these exercises right away. Do not do them again unless your health care provider says that you can. Get help right away if: You develop sudden, severe back pain. If this happens, stop doing the exercises right away. Do not do them again unless your health care provider says that you can. This information is not intended to replace advice given to you by your health care provider. Make sure you discuss any questions you have with your health care provider. Document Revised: 08/03/2020 Document Reviewed: 04/21/2020 Elsevier Patient Education  [employer].

## 2021-09-23 NOTE — Assessment & Plan Note (Signed)
I am unsure of a cause for this.  At this point is resolved.  If it recurs he will let us know.

## 2021-09-23 NOTE — Assessment & Plan Note (Signed)
Patient is currently taking 800 international units daily.  Recheck vitamin D.

## 2021-09-23 NOTE — Progress Notes (Signed)
Cole Rumps, MD Phone: [phone]  Cole Lane is a 46 y.o. male who presents today for CPE.  Diet: generally healthy, not much eating out, does have lots of vegetables, lean meats, no soda or sweet tea, only occasional sweets Exercise: not much lately though he is going on a 45 mile bike ride in [place] and is going to start training soon for this. Colonoscopy: not indicated Prostate cancer screening: not indicated Family history-  Prostate cancer: no  Colon cancer: no Vaccines-   Flu: due in the fall  Tetanus: UTD  COVID19: x4 HIV screening: UTD Hep C Screening: UTD Tobacco use: no Alcohol use: no Illicit Drug use: no Dentist: yes Ophthalmology: yes  Dry mouth: Patient notes about a month ago he had dry mouth for about a week.  He noted this was after he had a temporary crown placed on a tooth.  He noted some discomfort in his mouth.  He notes at this point it has resolved.  He did discuss it with his dentist who was unsure of why this would have occurred.  Low back pain: Patient notes earlier this week he woke up with some discomfort in his left low back.  He notes it hurts more in certain positions particularly if stooping.  It is a sharp pain.  No specific injury though he did go on a hike and played guitar for 5 hours on the sofa.  No radiation.  No numbness.  No weakness.  This has been improving.   Active Ambulatory Problems    Diagnosis Date Noted   Elevated LFTs 09/26/2016   Skin lesions 09/26/2016   Allergic rhinitis 09/26/2016   Vitamin D deficiency 09/26/2016   Hereditary hemochromatosis ([place]) 10/29/2016   Iron overload 10/29/2016   Headache 06/01/2017   Renal cyst 06/01/2017   Encounter for general adult medical examination with abnormal findings 09/22/2018   Memory difficulty 09/22/2018   Tinnitus 09/19/2019   Elevated LDL cholesterol level 10/24/2019   Multiple nevi 09/22/2020   Low back pain 09/23/2021   Dry mouth 09/23/2021   Resolved  Ambulatory Problems    Diagnosis Date Noted   Palpitations 09/26/2016   Right foot pain 06/01/2017   Past Medical History:  Diagnosis Date   Allergy    Asthma    Hemochromatosis    Kidney stone    Palpitation     Family History  Problem Relation Age of Onset   Asthma Maternal Aunt    Hypertension Other        Parent   Stroke Other        Grandparent   Mental illness Other        Grandparent   Arthritis Other        Grandparent   Hypertension Father     Social History   Socioeconomic History   Marital status: Married    Spouse name: Not on file   Number of children: Not on file   Years of education: Not on file   Highest education level: Not on file  Occupational History   Not on file  Tobacco Use   Smoking status: Never   Smokeless tobacco: Never  Vaping Use   Vaping Use: Never used  Substance and Sexual Activity   Alcohol use: No   Drug use: No   Sexual activity: Yes  Other Topics Concern   Not on file  Social History Narrative   Not on file   Social Determinants of Health   Financial Resource Strain:  Not on file  Food Insecurity: Not on file  Transportation Needs: Not on file  Physical Activity: Not on file  Stress: Not on file  Social Connections: Not on file  Intimate Partner Violence: Not on file    ROS  General:  Negative for nexplained weight loss, fever Skin: Negative for new or changing mole, sore that won't heal HEENT: Negative for trouble hearing, trouble seeing, ringing in ears, mouth sores, hoarseness, change in voice, dysphagia. CV:  Negative for chest pain, dyspnea, edema, palpitations Resp: Negative for cough, dyspnea, hemoptysis GI: Negative for nausea, vomiting, diarrhea, constipation, abdominal pain, melena, hematochezia. GU: Negative for dysuria, incontinence, urinary hesitance, hematuria, vaginal or penile discharge, polyuria, sexual difficulty, lumps in testicle or breasts MSK: Negative for muscle cramps or aches, joint  pain or swelling Neuro: Negative for headaches, weakness, numbness, dizziness, passing out/fainting Psych: Negative for depression, anxiety, memory problems  Objective  Physical Exam Vitals:   09/23/21 0829  BP: 90/60  Pulse: 75  Temp: 98.3 F (36.8 C)  SpO2: 95%    BP Readings from Last 3 Encounters:  09/23/21 90/60  09/15/21 119/85  03/16/21 105/80   Wt Readings from Last 3 Encounters:  09/23/21 155 lb (70.3 kg)  09/15/21 153 lb (69.4 kg)  03/16/21 157 lb (71.2 kg)    Physical Exam Constitutional:      General: He is not in acute distress.    Appearance: He is not diaphoretic.  HENT:     Head: Normocephalic and atraumatic.     Mouth/Throat:     Mouth: Mucous membranes are moist.     Pharynx: Oropharynx is clear.  Cardiovascular:     Rate and Rhythm: Normal rate and regular rhythm.     Heart sounds: Normal heart sounds.  Pulmonary:     Effort: Pulmonary effort is normal.     Breath sounds: Normal breath sounds.  Abdominal:     General: Bowel sounds are normal. There is no distension.     Palpations: Abdomen is soft.     Tenderness: There is no abdominal tenderness.  Musculoskeletal:     Right lower leg: No edema.     Left lower leg: No edema.     Comments: No midline spine tenderness, no midline spine step-off, no muscular back tenderness  Skin:    General: Skin is warm and dry.  Neurological:     Mental Status: He is alert.     Comments: 5/5 strength bilateral quads, hamstrings, plantarflexion, and dorsiflexion, sensation to light touch intact bilateral lower extremities, absent patellar reflexes bilaterally      Assessment/Plan:   Problem List Items Addressed This Visit     Vitamin D deficiency (Chronic)    Patient is currently taking 800 international units daily.  Recheck vitamin D.      Relevant Orders   Vitamin D (25 hydroxy)   Dry mouth    I am unsure of a cause for this.  At this point is resolved.  If it recurs he will let us know.       Encounter for general adult medical examination with abnormal findings - Primary    Physical exam completed.  Encouraged getting back to exercising.  He will continue healthy diet.  Discussed updated COVID booster and advised he could get this at the pharmacy if he is willing to get this.  Recent lab work reviewed.  Labs as outlined.      Low back pain    Suspect muscular strain.  This has been improving.  He will monitor.  I will give him exercises to complete at home.      Other Visit Diagnoses     Lipid screening       Relevant Orders   Lipid panel       Return in about 1 year (around 09/24/2022) for cpe.   Cole Rumps, MD [place]

## 2021-09-23 NOTE — Assessment & Plan Note (Signed)
Suspect muscular strain.  This has been improving.  He will monitor.  I will give him exercises to complete at home.

## 2021-09-26 ENCOUNTER — Other Ambulatory Visit: Payer: Self-pay | Admitting: Family Medicine

## 2021-09-26 MED ORDER — VITAMIN D (ERGOCALCIFEROL) 1.25 MG (50000 UNIT) PO CAPS: 50000.0000 [IU] | ORAL_CAPSULE | ORAL | 0 refills | Status: DC

## 2021-11-24 ENCOUNTER — Other Ambulatory Visit: Payer: Self-pay

## 2021-11-24 ENCOUNTER — Telehealth: Payer: Self-pay | Admitting: Family Medicine

## 2021-11-24 DIAGNOSIS — E559 Vitamin D deficiency, unspecified: Secondary | ICD-10-CM

## 2021-11-24 NOTE — Telephone Encounter (Signed)
Labs ordered.  Kevin Mario,cma  

## 2021-11-24 NOTE — Telephone Encounter (Signed)
Patient has a lab appt 11/28/2021, there are no orders in.

## 2021-11-28 ENCOUNTER — Other Ambulatory Visit: Payer: Self-pay | Admitting: Family Medicine

## 2021-11-28 ENCOUNTER — Other Ambulatory Visit (INDEPENDENT_AMBULATORY_CARE_PROVIDER_SITE_OTHER): Payer: BC Managed Care – PPO

## 2021-11-28 DIAGNOSIS — E559 Vitamin D deficiency, unspecified: Secondary | ICD-10-CM | POA: Diagnosis not present

## 2021-11-28 LAB — VITAMIN D 25 HYDROXY (VIT D DEFICIENCY, FRACTURES): VITD: 40.08 ng/mL (ref 30.00–100.00)

## 2021-11-29 ENCOUNTER — Other Ambulatory Visit: Payer: Self-pay | Admitting: Family Medicine

## 2021-11-29 NOTE — Telephone Encounter (Signed)
He does not need refills. Please see the result note on his vitamin D.

## 2021-12-01 NOTE — Telephone Encounter (Signed)
Patient is now taking OVC vitamin d.  Brysun Eschmann,cma

## 2022-01-30 LAB — HEPATIC FUNCTION PANEL
ALT: 34 U/L (ref 10–40)
AST: 44 — AB (ref 14–40)
Alkaline Phosphatase: 89 (ref 25–125)
Bilirubin, Total: 0.6

## 2022-01-30 LAB — BASIC METABOLIC PANEL
Chloride: 105 (ref 99–108)
Creatinine: 0.9 (ref 0.6–1.3)
Glucose: 94
Potassium: 4.3 mEq/L (ref 3.5–5.1)
Sodium: 143 (ref 137–147)

## 2022-01-30 LAB — LIPID PANEL
Cholesterol: 170 (ref 0–200)
HDL: 48 (ref 35–70)
LDL Cholesterol: 113
LDl/HDL Ratio: 3.5
Triglycerides: 45 (ref 40–160)

## 2022-01-30 LAB — CBC AND DIFFERENTIAL
HCT: 40 — AB (ref 41–53)
Hemoglobin: 13.4 — AB (ref 13.5–17.5)
Neutrophils Absolute: 54
Platelets: 210 10*3/uL (ref 150–400)
WBC: 5.9

## 2022-01-30 LAB — TSH: TSH: 1.69 (ref 0.41–5.90)

## 2022-01-30 LAB — CBC: RBC: 4.38 (ref 3.87–5.11)

## 2022-01-30 LAB — COMPREHENSIVE METABOLIC PANEL
Albumin: 4.6 (ref 3.5–5.0)
Calcium: 9.2 (ref 8.7–10.7)
Globulin: 2.4

## 2022-01-30 LAB — IRON,TIBC AND FERRITIN PANEL: Iron: 116

## 2022-01-30 LAB — VITAMIN D 25 HYDROXY (VIT D DEFICIENCY, FRACTURES): Vit D, 25-Hydroxy: 31.3

## 2022-02-03 ENCOUNTER — Encounter: Payer: Self-pay | Admitting: Family Medicine

## 2022-02-08 ENCOUNTER — Telehealth: Payer: Self-pay

## 2022-02-08 NOTE — Telephone Encounter (Signed)
Patients labs from [facility] are abstracted and in the lab basket for your review.  Cole Lane,cma

## 2022-02-08 NOTE — Telephone Encounter (Signed)
Reviewed and stable.

## 2022-03-01 ENCOUNTER — Other Ambulatory Visit: Payer: BC Managed Care – PPO

## 2022-04-13 ENCOUNTER — Encounter: Payer: Self-pay | Admitting: Family Medicine

## 2022-04-14 ENCOUNTER — Encounter: Payer: Self-pay | Admitting: Nurse Practitioner

## 2022-04-14 ENCOUNTER — Ambulatory Visit: Payer: BC Managed Care – PPO | Admitting: Nurse Practitioner

## 2022-04-14 VITALS — BP 130/70 | HR 58 | Temp 97.8°F | Resp 15 | Ht 70.0 in | Wt 159.6 lb

## 2022-04-14 DIAGNOSIS — L989 Disorder of the skin and subcutaneous tissue, unspecified: Secondary | ICD-10-CM | POA: Diagnosis not present

## 2022-04-14 DIAGNOSIS — R21 Rash and other nonspecific skin eruption: Secondary | ICD-10-CM

## 2022-04-14 NOTE — Progress Notes (Signed)
Established Patient Office Visit  Subjective:  Patient ID: Cole Lane, male    DOB: Oct 26, 1975  Age: 47 y.o. MRN: [id number]  CC:  Chief Complaint  Patient presents with   Acute Visit     HPI  Cole Lane presents for lesion the nose and back..  He states that the lesion on the nose was crusty and the crust fall off last night.  He is wearing a small bandage on the spot. He also reports that some rashes on the back that he wants to get evaluated.  Denies any fever, shortness of breath chest pain at present.   HPI   Past Medical History:  Diagnosis Date   Allergic rhinitis    Allergy    Asthma    Hemochromatosis    Kidney stone    Palpitation     Past Surgical History:  Procedure Laterality Date   DERMOID CYST  EXCISION      Family History  Problem Relation Age of Onset   Asthma Maternal Aunt    Hypertension Other        Parent   Stroke Other        Grandparent   Mental illness Other        Grandparent   Arthritis Other        Grandparent   Hypertension Father     Social History   Socioeconomic History   Marital status: Married    Spouse name: Not on file   Number of children: Not on file   Years of education: Not on file   Highest education level: Not on file  Occupational History   Not on file  Tobacco Use   Smoking status: Never   Smokeless tobacco: Never  Vaping Use   Vaping Use: Never used  Substance and Sexual Activity   Alcohol use: No   Drug use: No   Sexual activity: Yes  Other Topics Concern   Not on file  Social History Narrative   Not on file   Social Determinants of Health   Financial Resource Strain: Not on file  Food Insecurity: Not on file  Transportation Needs: Not on file  Physical Activity: Not on file  Stress: Not on file  Social Connections: Not on file  Intimate Partner Violence: Not on file     Outpatient Medications Prior to Visit  Medication Sig Dispense Refill   cholecalciferol (VITAMIN D3)  25 MCG (1000 UNIT) tablet Take 1,000 Units by mouth daily. OVC     ibuprofen (ADVIL) 800 MG tablet Take 1 tablet (800 mg total) by mouth every 8 (eight) hours as needed. 21 tablet 0   Vitamin D, Ergocalciferol, (DRISDOL) 1.25 MG (50000 UNIT) CAPS capsule Take 1 capsule (50,000 Units total) by mouth every 7 (seven) days. 8 capsule 0   No facility-administered medications prior to visit.    Allergies  Allergen Reactions   Penicillin G Other (See Comments)    ROS Review of Systems  Constitutional: Negative.   HENT: Negative.    Respiratory: Negative.    Cardiovascular:  Negative for chest pain and palpitations.  Skin:  Positive for rash.  Neurological: Negative.   Psychiatric/Behavioral: Negative.        Objective:    Physical Exam Constitutional:      Appearance: Normal appearance.  HENT:     Head: Normocephalic.  Eyes:     Extraocular Movements: Extraocular movements intact.     Conjunctiva/sclera: Conjunctivae normal.     Pupils: Pupils  are equal, round, and reactive to light.  Cardiovascular:     Rate and Rhythm: Normal rate and regular rhythm.     Pulses: Normal pulses.     Heart sounds: Normal heart sounds.  Pulmonary:     Effort: Pulmonary effort is normal.     Breath sounds: Normal breath sounds. No stridor. No wheezing or rhonchi.  Skin:    Findings: Rash (Erythematous macule to the nose) present.     Comments: Hyper pigmented skin lesion to back and shoulder  Neurological:     Mental Status: He is alert.     BP 130/70   Pulse (!) 58   Temp 97.8 F (36.6 C) (Temporal)   Resp 15   Ht '5\' 10"'$  (1.778 m)   Wt 159 lb 9.6 oz (72.4 kg)   SpO2 98%   BMI 22.90 kg/m  Wt Readings from Last 3 Encounters:  04/14/22 159 lb 9.6 oz (72.4 kg)  09/23/21 155 lb (70.3 kg)  09/15/21 153 lb (69.4 kg)     Health Maintenance  Topic Date Due   COVID-19 Vaccine (4 - 2023-24 season) 04/30/2022 (Originally 10/21/2021)   DTaP/Tdap/Td (2 - Td or Tdap) 12/02/2026    INFLUENZA VACCINE  Completed   Hepatitis C Screening  Completed   HIV Screening  Completed   HPV VACCINES  Aged Out    There are no preventive care reminders to display for this patient.  Lab Results  Component Value Date   TSH 1.69 01/30/2022   Lab Results  Component Value Date   WBC 5.9 01/30/2022   HGB 13.4 (A) 01/30/2022   HCT 40 (A) 01/30/2022   MCV 91.0 09/13/2021   PLT 210 01/30/2022   Lab Results  Component Value Date   NA 143 01/30/2022   K 4.3 01/30/2022   CO2 27 09/13/2021   GLUCOSE 96 09/13/2021   BUN 12 09/13/2021   CREATININE 0.9 01/30/2022   BILITOT 1.0 09/13/2021   ALKPHOS 89 01/30/2022   AST 44 (A) 01/30/2022   ALT 34 01/30/2022   PROT 7.9 09/13/2021   ALBUMIN 4.6 01/30/2022   CALCIUM 9.2 01/30/2022   ANIONGAP 8 09/13/2021   GFR 108.29 09/22/2020   Lab Results  Component Value Date   CHOL 170 01/30/2022   Lab Results  Component Value Date   HDL 48 01/30/2022   Lab Results  Component Value Date   LDLCALC 113 01/30/2022   Lab Results  Component Value Date   TRIG 45 01/30/2022   Lab Results  Component Value Date   CHOLHDL 4 09/23/2021   Lab Results  Component Value Date   HGBA1C 5.3 10/30/2016      Assessment & Plan:   Problem List Items Addressed This Visit       Musculoskeletal and Integument   Skin lesions    Apply Neosporin rash to the nose. He is followed by dermatologist.      Other Visit Diagnoses     Rash    -  Primary        No orders of the defined types were placed in this encounter.    Follow-up: Return if symptoms worsen or fail to improve.    Theresia Lo, NP

## 2022-04-14 NOTE — Patient Instructions (Addendum)
Use Neosporin cream for the rash on the nose. Keep an eye on the shoulder and back. Follow-up with dermatologist.

## 2022-04-14 NOTE — Telephone Encounter (Signed)
Pt called in to book an appt, he scheduled with [facility] today '@11'$ :40am.

## 2022-04-30 ENCOUNTER — Encounter: Payer: Self-pay | Admitting: Nurse Practitioner

## 2022-04-30 NOTE — Assessment & Plan Note (Signed)
Apply Neosporin rash to the nose. He is followed by dermatologist.

## 2022-05-17 ENCOUNTER — Ambulatory Visit: Payer: BC Managed Care – PPO | Admitting: Dermatology

## 2022-05-24 ENCOUNTER — Encounter: Payer: Self-pay | Admitting: Dermatology

## 2022-05-24 ENCOUNTER — Ambulatory Visit: Payer: BC Managed Care – PPO | Admitting: Dermatology

## 2022-05-24 VITALS — BP 107/77 | HR 79

## 2022-05-24 DIAGNOSIS — D485 Neoplasm of uncertain behavior of skin: Secondary | ICD-10-CM | POA: Diagnosis not present

## 2022-05-24 DIAGNOSIS — D239 Other benign neoplasm of skin, unspecified: Secondary | ICD-10-CM

## 2022-05-24 DIAGNOSIS — L578 Other skin changes due to chronic exposure to nonionizing radiation: Secondary | ICD-10-CM | POA: Diagnosis not present

## 2022-05-24 DIAGNOSIS — Z1283 Encounter for screening for malignant neoplasm of skin: Secondary | ICD-10-CM | POA: Diagnosis not present

## 2022-05-24 DIAGNOSIS — L820 Inflamed seborrheic keratosis: Secondary | ICD-10-CM | POA: Diagnosis not present

## 2022-05-24 DIAGNOSIS — D489 Neoplasm of uncertain behavior, unspecified: Secondary | ICD-10-CM

## 2022-05-24 DIAGNOSIS — L738 Other specified follicular disorders: Secondary | ICD-10-CM

## 2022-05-24 DIAGNOSIS — D229 Melanocytic nevi, unspecified: Secondary | ICD-10-CM

## 2022-05-24 DIAGNOSIS — L821 Other seborrheic keratosis: Secondary | ICD-10-CM

## 2022-05-24 DIAGNOSIS — L814 Other melanin hyperpigmentation: Secondary | ICD-10-CM

## 2022-05-24 DIAGNOSIS — D1801 Hemangioma of skin and subcutaneous tissue: Secondary | ICD-10-CM

## 2022-05-24 DIAGNOSIS — D2262 Melanocytic nevi of left upper limb, including shoulder: Secondary | ICD-10-CM

## 2022-05-24 HISTORY — DX: Other benign neoplasm of skin, unspecified: D23.9

## 2022-05-24 NOTE — Patient Instructions (Addendum)
Biopsy Wound Care Instructions  Leave the original bandage on for 24 hours if possible.  If the bandage becomes soaked or soiled before that time, it is OK to remove it and examine the wound.  A small amount of post-operative bleeding is normal.  If excessive bleeding occurs, remove the bandage, place gauze over the site and apply continuous pressure (no peeking) over the area for 30 minutes. If this does not work, please call our clinic as soon as possible or page your doctor if it is after hours.   Once a day, cleanse the wound with soap and water. It is fine to shower. If a thick crust develops you may use a Q-tip dipped into dilute hydrogen peroxide (mix 1:1 with water) to dissolve it.  Hydrogen peroxide can slow the healing process, so use it only as needed.    After washing, apply petroleum jelly (Vaseline) or an antibiotic ointment if your doctor prescribed one for you, followed by a bandage.    For best healing, the wound should be covered with a layer of ointment at all times. If you are not able to keep the area covered with a bandage to hold the ointment in place, this may mean re-applying the ointment several times a day.  Continue this wound care until the wound has healed and is no longer open.   Itching and mild discomfort is normal during the healing process. However, if you develop pain or severe itching, please call our office.   If you have any discomfort, you can take Tylenol (acetaminophen) or ibuprofen as directed on the bottle. (Please do not take these if you have an allergy to them or cannot take them for another reason).  Some redness, tenderness and white or yellow material in the wound is normal healing.  If the area becomes very sore and red, or develops a thick yellow-green material (pus), it may be infected; please notify us.    If you have stitches, return to clinic as directed to have the stitches removed. You will continue wound care for 2-3 days after the stitches  are removed.   Wound healing continues for up to one year following surgery. It is not unusual to experience pain in the scar from time to time during the interval.  If the pain becomes severe or the scar thickens, you should notify the office.    A slight amount of redness in a scar is expected for the first six months.  After six months, the redness will fade and the scar will soften and fade.  The color difference becomes less noticeable with time.  If there are any problems, return for a post-op surgery check at your earliest convenience.  To improve the appearance of the scar, you can use silicone scar gel, cream, or sheets (such as Mederma or Serica) every night for up to one year. These are available over the counter (without a prescription).  Please call our office at [phone] for any questions or concerns.  Cryotherapy Aftercare  Wash gently with soap and water everyday.   Apply Vaseline and Band-Aid daily until healed.   Seborrheic Keratosis  What causes seborrheic keratoses? Seborrheic keratoses are harmless, common skin growths that first appear during adult life.  As time goes by, more growths appear.  Some people may develop a large number of them.  Seborrheic keratoses appear on both covered and uncovered body parts.  They are not caused by sunlight.  The tendency to develop seborrheic keratoses   can be inherited.  They vary in color from skin-colored to gray, brown, or even black.  They can be either smooth or have a rough, warty surface.   Seborrheic keratoses are superficial and look as if they were stuck on the skin.  Under the microscope this type of keratosis looks like layers upon layers of skin.  That is why at times the top layer may seem to fall off, but the rest of the growth remains and re-grows.    Treatment Seborrheic keratoses do not need to be treated, but can easily be removed in the office.  Seborrheic keratoses often cause symptoms when they rub on clothing  or jewelry.  Lesions can be in the way of shaving.  If they become inflamed, they can cause itching, soreness, or burning.  Removal of a seborrheic keratosis can be accomplished by freezing, burning, or surgery. If any spot bleeds, scabs, or grows rapidly, please return to have it checked, as these can be an indication of a skin cancer.    Melanoma ABCDEs  Melanoma is the most dangerous type of skin cancer, and is the leading cause of death from skin disease.  You are more likely to develop melanoma if you: Have light-colored skin, light-colored eyes, or red or blond hair Spend a lot of time in the sun Tan regularly, either outdoors or in a tanning bed Have had blistering sunburns, especially during childhood Have a close family member who has had a melanoma Have atypical moles or large birthmarks  Early detection of melanoma is key since treatment is typically straightforward and cure rates are extremely high if we catch it early.   The first sign of melanoma is often a change in a mole or a new dark spot.  The ABCDE system is a way of remembering the signs of melanoma.  A for asymmetry:  The two halves do not match. B for border:  The edges of the growth are irregular. C for color:  A mixture of colors are present instead of an even brown color. D for diameter:  Melanomas are usually (but not always) greater than 6mm - the size of a pencil eraser. E for evolution:  The spot keeps changing in size, shape, and color.  Please check your skin once per month between visits. You can use a small mirror in front and a large mirror behind you to keep an eye on the back side or your body.   If you see any new or changing lesions before your next follow-up, please call to schedule a visit.  Please continue daily skin protection including broad spectrum sunscreen SPF 30+ to sun-exposed areas, reapplying every 2 hours as needed when you're outdoors.   Staying in the shade or wearing long sleeves,  sun glasses (UVA+UVB protection) and wide brim hats (4-inch brim around the entire circumference of the hat) are also recommended for sun protection.    Due to recent changes in healthcare laws, you may see results of your pathology and/or laboratory studies on MyChart before the doctors have had a chance to review them. We understand that in some cases there may be results that are confusing or concerning to you. Please understand that not all results are received at the same time and often the doctors may need to interpret multiple results in order to provide you with the best plan of care or course of treatment. Therefore, we ask that you please give us 2 business days to thoroughly review all   your results before contacting the office for clarification. Should we see a critical lab result, you will be contacted sooner.   If You Need Anything After Your Visit  If you have any questions or concerns for your doctor, please call our main line at [phone] and press option 4 to reach your doctor's medical assistant. If no one answers, please leave a voicemail as directed and we will return your call as soon as possible. Messages left after 4 pm will be answered the following business day.   You may also send us a message via MyChart. We typically respond to MyChart messages within 1-2 business days.  For prescription refills, please ask your pharmacy to contact our office. Our fax number is [phone].  If you have an urgent issue when the clinic is closed that cannot wait until the next business day, you can page your doctor at the number below.    Please note that while we do our best to be available for urgent issues outside of office hours, we are not available 24/7.   If you have an urgent issue and are unable to reach us, you may choose to seek medical care at your doctor's office, retail clinic, urgent care center, or emergency room.  If you have a medical emergency, please immediately  call 911 or go to the emergency department.  Pager Numbers  - Dr. Kowalski: [phone]  - Dr. Moye: [phone]  - Dr. Stewart: [phone]  In the event of inclement weather, please call our main line at [phone] for an update on the status of any delays or closures.  Dermatology Medication Tips: Please keep the boxes that topical medications come in in order to help keep track of the instructions about where and how to use these. Pharmacies typically print the medication instructions only on the boxes and not directly on the medication tubes.   If your medication is too expensive, please contact our office at [phone] option 4 or send us a message through MyChart.   We are unable to tell what your co-pay for medications will be in advance as this is different depending on your insurance coverage. However, we may be able to find a substitute medication at lower cost or fill out paperwork to get insurance to cover a needed medication.   If a prior authorization is required to get your medication covered by your insurance company, please allow us 1-2 business days to complete this process.  Drug prices often vary depending on where the prescription is filled and some pharmacies may offer cheaper prices.  The website www.goodrx.com contains coupons for medications through different pharmacies. The prices here do not account for what the cost may be with help from insurance (it may be cheaper with your insurance), but the website can give you the price if you did not use any insurance.  - You can print the associated coupon and take it with your prescription to the pharmacy.  - You may also stop by our office during regular business hours and pick up a GoodRx coupon card.  - If you need your prescription sent electronically to a different pharmacy, notify our office through [facility] MyChart or by phone at [phone] option 4.     Si Usted Necesita Algo Despus de Su  Visita  Tambin puede enviarnos un mensaje a travs de MyChart. Por lo general respondemos a los mensajes de MyChart en el transcurso de 1 a 2 das hbiles.  Para renovar recetas, por   favor pida a su farmacia que se ponga en contacto con nuestra oficina. Nuestro nmero de fax es el [phone].  Si tiene un asunto urgente cuando la clnica est cerrada y que no puede esperar hasta el siguiente da hbil, puede llamar/localizar a su doctor(a) al nmero que aparece a continuacin.   Por favor, tenga en cuenta que aunque hacemos todo lo posible para estar disponibles para asuntos urgentes fuera del horario de oficina, no estamos disponibles las 24 horas del da, los 7 das de la semana.   Si tiene un problema urgente y no puede comunicarse con nosotros, puede optar por buscar atencin mdica  en el consultorio de su doctor(a), en una clnica privada, en un centro de atencin urgente o en una sala de emergencias.  Si tiene una emergencia mdica, por favor llame inmediatamente al 911 o vaya a la sala de emergencias.  Nmeros de bper  - Dr. Kowalski: [phone]  - Dra. Moye: [phone]  - Dra. Stewart: [phone]  En caso de inclemencias del tiempo, por favor llame a nuestra lnea principal al [phone] para una actualizacin sobre el estado de cualquier retraso o cierre.  Consejos para la medicacin en dermatologa: Por favor, guarde las cajas en las que vienen los medicamentos de uso tpico para ayudarle a seguir las instrucciones sobre dnde y cmo usarlos. Las farmacias generalmente imprimen las instrucciones del medicamento slo en las cajas y no directamente en los tubos del medicamento.   Si su medicamento es muy caro, por favor, pngase en contacto con nuestra oficina llamando al [phone] y presione la opcin 4 o envenos un mensaje a travs de MyChart.   No podemos decirle cul ser su copago por los medicamentos por adelantado ya que esto es diferente dependiendo de la  cobertura de su seguro. Sin embargo, es posible que podamos encontrar un medicamento sustituto a menor costo o llenar un formulario para que el seguro cubra el medicamento que se considera necesario.   Si se requiere una autorizacin previa para que su compaa de seguros cubra su medicamento, por favor permtanos de 1 a 2 das hbiles para completar este proceso.  Los precios de los medicamentos varan con frecuencia dependiendo del lugar de dnde se surte la receta y alguna farmacias pueden ofrecer precios ms baratos.  El sitio web www.goodrx.com tiene cupones para medicamentos de diferentes farmacias. Los precios aqu no tienen en cuenta lo que podra costar con la ayuda del seguro (puede ser ms barato con su seguro), pero el sitio web puede darle el precio si no utiliz ningn seguro.  - Puede imprimir el cupn correspondiente y llevarlo con su receta a la farmacia.  - Tambin puede pasar por nuestra oficina durante el horario de atencin regular y recoger una tarjeta de cupones de GoodRx.  - Si necesita que su receta se enve electrnicamente a una farmacia diferente, informe a nuestra oficina a travs de MyChart de [facility] o por telfono llamando al [phone] y presione la opcin 4.  

## 2022-05-24 NOTE — Progress Notes (Signed)
Follow-Up Visit   Subjective  Cole Lane is a 47 y.o. male who presents for the following: Skin Cancer Screening and Full Body Skin Exam Patient concerned with a spot at left posterior shoulder, irritated spot at mid back, some bumps at face and spots at chest.   The patient presents for Total-Body Skin Exam (TBSE) for skin cancer screening and mole check. The patient has spots, moles and lesions to be evaluated, some may be new or changing and the patient has concerns that these could be cancer.   The following portions of the chart were reviewed this encounter and updated as appropriate: medications, allergies, medical history  Review of Systems:  No other skin or systemic complaints except as noted in HPI or Assessment and Plan.  Objective  Well appearing patient in no apparent distress; mood and affect are within normal limits.  A full examination was performed including scalp, head, eyes, ears, nose, lips, neck, chest, axillae, abdomen, back, buttocks, bilateral upper extremities, bilateral lower extremities, hands, feet, fingers, toes, fingernails, and toenails. All findings within normal limits unless otherwise noted below.   Relevant physical exam findings are noted in the Assessment and Plan.  Left Shoulder - Posterior 0.6 cm Irregular brown flat papule        right superior forehead 0.6 cm Irregular brown flat papule       Assessment & Plan    INFLAMED SEBORRHEIC KERATOSIS Exam: Erythematous keratotic or waxy stuck-on papule or plaque.  Symptomatic, irritating, patient would like treated.  Benign-appearing.  Call clinic for new or changing lesions.   Prior to procedure, discussed risks of blister formation, small wound, skin dyspigmentation, or rare scar following treatment. Recommend Vaseline ointment to treated areas while healing.  Destruction Procedure Note Destruction method: cryotherapy   Informed consent: discussed and consent obtained    Lesion destroyed using liquid nitrogen: Yes   Outcome: patient tolerated procedure well with no complications   Post-procedure details: wound care instructions given   Locations: left chest x 2, mid back x 1 # of Lesions Treated: 3  LENTIGINES, SEBORRHEIC KERATOSES at back patient defers treatment , HEMANGIOMAS - Benign normal skin lesions - Benign-appearing - Call for any changes  Sebaceous Hyperplasia - Small yellow papules with a central dell - Benign-appearing - Observe. Call for changes.  MELANOCYTIC NEVI - Tan-brown and/or pink-flesh-colored symmetric macules and papules - Benign appearing on exam today - Observation - Call clinic for new or changing moles - Recommend daily use of broad spectrum spf 30+ sunscreen to sun-exposed areas.   ACTINIC DAMAGE - Chronic condition, secondary to cumulative UV/sun exposure - diffuse scaly erythematous macules with underlying dyspigmentation - Recommend daily broad spectrum sunscreen SPF 30+ to sun-exposed areas, reapply every 2 hours as needed.  - Staying in the shade or wearing long sleeves, sun glasses (UVA+UVB protection) and wide brim hats (4-inch brim around the entire circumference of the hat) are also recommended for sun protection.  - Call for new or changing lesions.  SKIN CANCER SCREENING PERFORMED TODAY.    Neoplasm of uncertain behavior (2) Left Shoulder - Posterior  Epidermal / dermal shaving  Lesion diameter (cm):  0.6 Informed consent: discussed and consent obtained   Timeout: patient name, date of birth, surgical site, and procedure verified   Procedure prep:  Patient was prepped and draped in usual sterile fashion Prep type:  Isopropyl alcohol Anesthesia: the lesion was anesthetized in a standard fashion   Anesthetic:  1% lidocaine w/ epinephrine 1-100,000  buffered w/ 8.4% NaHCO3 Instrument used: flexible razor blade   Hemostasis achieved with: pressure, aluminum chloride and electrodesiccation   Outcome:  patient tolerated procedure well   Post-procedure details: sterile dressing applied and wound care instructions given   Dressing type: bandage and petrolatum    Specimen 1 - Surgical pathology Differential Diagnosis: r/o dysplasia   Check Margins: No  right superior forehead  Epidermal / dermal shaving  Lesion diameter (cm):  0.6 Informed consent: discussed and consent obtained   Timeout: patient name, date of birth, surgical site, and procedure verified   Procedure prep:  Patient was prepped and draped in usual sterile fashion Prep type:  Isopropyl alcohol Anesthesia: the lesion was anesthetized in a standard fashion   Anesthetic:  1% lidocaine w/ epinephrine 1-100,000 buffered w/ 8.4% NaHCO3 Instrument used: flexible razor blade   Hemostasis achieved with: pressure, aluminum chloride and electrodesiccation   Outcome: patient tolerated procedure well   Post-procedure details: sterile dressing applied and wound care instructions given   Dressing type: bandage and petrolatum    Specimen 2 - Surgical pathology Differential Diagnosis: r/o dysplasia  Check Margins: No  R/o dysplasia    Return in about 1 year (around 05/24/2023) for TBSE.  IRuthell Rummage, CMA, am acting as scribe for Sarina Ser, MD.   Documentation: I have reviewed the above documentation for accuracy and completeness, and I agree with the above.  Sarina Ser, MD

## 2022-05-26 ENCOUNTER — Encounter: Payer: Self-pay | Admitting: Dermatology

## 2022-06-01 ENCOUNTER — Telehealth: Payer: Self-pay

## 2022-06-01 NOTE — Telephone Encounter (Signed)
Patient informed of pathology results 

## 2022-06-01 NOTE — Telephone Encounter (Signed)
-----   Message from Deirdre Evener, MD sent at 05/30/2022  5:48 PM EDT ----- Diagnosis 1. 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 Skin , right superior forehead PIGMENTED SEBORRHEIC KERATOSIS  1- Moderate dysplastic Recheck next visit 2- benign keratosis No further treatment needed

## 2022-07-12 ENCOUNTER — Ambulatory Visit: Payer: BC Managed Care – PPO | Admitting: Family Medicine

## 2022-07-12 VITALS — BP 126/78 | HR 61 | Temp 97.6°F | Ht 70.0 in | Wt 163.8 lb

## 2022-07-12 DIAGNOSIS — H6120 Impacted cerumen, unspecified ear: Secondary | ICD-10-CM | POA: Insufficient documentation

## 2022-07-12 DIAGNOSIS — H6123 Impacted cerumen, bilateral: Secondary | ICD-10-CM | POA: Diagnosis not present

## 2022-07-12 NOTE — Progress Notes (Signed)
  Marikay Alar, MD Phone: [phone]  Cole Lane is a 47 y.o. male who presents today for same day visit.   Cerumen impaction: Patient reports it feels as though his ears both have cerumen impaction.  He notes last night he woke up and he had reduced high-frequency hearing in his right ear.  He notes very rare tinnitus.  No pain in his ears.  He feels as though both ears are blocked up.  Social History   Tobacco Use  Smoking Status Never  Smokeless Tobacco Never    Current Outpatient Medications on File Prior to Visit  Medication Sig Dispense Refill   cholecalciferol (VITAMIN D3) 25 MCG (1000 UNIT) tablet Take 1,000 Units by mouth daily. OVC     ibuprofen (ADVIL) 800 MG tablet Take 1 tablet (800 mg total) by mouth every 8 (eight) hours as needed. (Patient not taking: Reported on 07/12/2022) 21 tablet 0   No current facility-administered medications on file prior to visit.     ROS see history of present illness  Objective  Physical Exam Vitals:   07/12/22 1056  BP: 126/78  Pulse: 61  Temp: 97.6 F (36.4 C)  SpO2: 99%    BP Readings from Last 3 Encounters:  07/12/22 126/78  05/24/22 107/77  04/14/22 130/70   Wt Readings from Last 3 Encounters:  07/12/22 163 lb 12.8 oz (74.3 kg)  04/14/22 159 lb 9.6 oz (72.4 kg)  09/23/21 155 lb (70.3 kg)    Physical Exam Constitutional:      General: He is not in acute distress. HENT:     Ears:     Comments: Both ear canals have cerumen impaction Neurological:     Mental Status: He is alert.      Assessment/Plan: Please see individual problem list.  Bilateral impacted cerumen Assessment & Plan: Bilateral ears irrigated by CMA and tolerated well by patient.  Patient reports good benefit with the irrigation.  Normal right TM noted after irrigation.  Left TM still obscured by a small amount of cerumen.  Encouraged use of Debrox in the left ear.  Also encouraged use of Debrox in the future when it starts to feel as  though his ears are getting full of wax.  If he feels as though the Debrox is not helpful moving forward he can contact us for irrigation.     Return if symptoms worsen or fail to improve.   Marikay Alar, MD [facility]

## 2022-07-12 NOTE — Assessment & Plan Note (Addendum)
Bilateral ears irrigated by CMA and tolerated well by patient.  Patient reports good benefit with the irrigation.  Normal right TM noted after irrigation.  Left TM still obscured by a small amount of cerumen.  Encouraged use of Debrox in the left ear.  Also encouraged use of Debrox in the future when it starts to feel as though his ears are getting full of wax.  If he feels as though the Debrox is not helpful moving forward he can contact us for irrigation.

## 2022-07-12 NOTE — Patient Instructions (Signed)
Nice to see you. In the future when you start to feel this way with your ears you can start using Debrox over-the-counter.

## 2022-09-15 ENCOUNTER — Inpatient Hospital Stay: Payer: BC Managed Care – PPO | Attending: Oncology

## 2022-09-15 DIAGNOSIS — E559 Vitamin D deficiency, unspecified: Secondary | ICD-10-CM

## 2022-09-15 DIAGNOSIS — R7401 Elevation of levels of liver transaminase levels: Secondary | ICD-10-CM

## 2022-09-15 LAB — COMPREHENSIVE METABOLIC PANEL
ALT: 34 U/L (ref 0–44)
AST: 44 U/L — ABNORMAL HIGH (ref 15–41)
Albumin: 4.5 g/dL (ref 3.5–5.0)
Alkaline Phosphatase: 65 U/L (ref 38–126)
Anion gap: 9 (ref 5–15)
BUN: 12 mg/dL (ref 6–20)
CO2: 25 mmol/L (ref 22–32)
Calcium: 8.8 mg/dL — ABNORMAL LOW (ref 8.9–10.3)
Chloride: 102 mmol/L (ref 98–111)
Creatinine, Ser: 0.76 mg/dL (ref 0.61–1.24)
GFR, Estimated: >60 mL/min (ref >60)
Glucose, Bld: 87 mg/dL (ref 70–99)
Potassium: 3.9 mmol/L (ref 3.5–5.1)
Sodium: 136 mmol/L (ref 135–145)
Total Bilirubin: 1.1 mg/dL (ref 0.3–1.2)
Total Protein: 8.1 g/dL (ref 6.5–8.1)

## 2022-09-15 LAB — CBC WITH DIFFERENTIAL/PLATELET
Abs Immature Granulocytes: 0.02 10*3/uL (ref 0.00–0.07)
Basophils Absolute: 0 10*3/uL (ref 0.0–0.1)
Basophils Relative: 1 %
Eosinophils Absolute: 0.1 10*3/uL (ref 0.0–0.5)
Eosinophils Relative: 1 %
HCT: 42.3 % (ref 39.0–52.0)
Hemoglobin: 14 g/dL (ref 13.0–17.0)
Immature Granulocytes: 0 %
Lymphocytes Relative: 30 %
Lymphs Abs: 1.7 10*3/uL (ref 0.7–4.0)
MCH: 30 pg (ref 26.0–34.0)
MCHC: 33.1 g/dL (ref 30.0–36.0)
MCV: 90.8 fL (ref 80.0–100.0)
Monocytes Absolute: 0.5 10*3/uL (ref 0.1–1.0)
Monocytes Relative: 8 %
Neutro Abs: 3.4 10*3/uL (ref 1.7–7.7)
Neutrophils Relative %: 60 %
Platelets: 192 10*3/uL (ref 150–400)
RBC: 4.66 MIL/uL (ref 4.22–5.81)
RDW: 11.7 % (ref 11.5–15.5)
WBC: 5.7 10*3/uL (ref 4.0–10.5)
nRBC: 0 % (ref 0.0–0.2)

## 2022-09-15 LAB — IRON AND TIBC
Iron: 161 ug/dL (ref 45–182)
Saturation Ratios: 48 % — ABNORMAL HIGH (ref 17.9–39.5)
TIBC: 335 ug/dL (ref 250–450)
UIBC: 174 ug/dL

## 2022-09-15 LAB — FERRITIN: Ferritin: 134 ng/mL (ref 24–336)

## 2022-09-18 ENCOUNTER — Ambulatory Visit: Payer: BC Managed Care – PPO | Admitting: Oncology

## 2022-09-18 ENCOUNTER — Other Ambulatory Visit: Payer: BC Managed Care – PPO

## 2022-09-19 ENCOUNTER — Encounter: Payer: Self-pay | Admitting: Oncology

## 2022-09-19 ENCOUNTER — Inpatient Hospital Stay: Payer: BC Managed Care – PPO

## 2022-09-19 ENCOUNTER — Inpatient Hospital Stay: Payer: BC Managed Care – PPO | Admitting: Oncology

## 2022-09-19 NOTE — Assessment & Plan Note (Addendum)
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  Labs are reviewed and discussed with patient. Lab Results  Component Value Date   HGB 14.0 09/15/2022   TIBC 335 09/15/2022   IRONPCTSAT 48 (H) 09/15/2022   FERRITIN 134 09/15/2022    Patient has stable slightly increased iron saturation, ferritin 134. LFT is stable, mildly increased AST which is chronic. No need for therapeutic phlebotomy at this time. Avoid iron supplementation, alcohol use. Continue observation and expectant management.  He agrees with the plan.

## 2022-09-19 NOTE — Progress Notes (Signed)
No phlebotomy today per Dr Tasia Catchings.

## 2022-09-19 NOTE — Progress Notes (Signed)
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  Labs are reviewed and discussed with patient. Lab Results  Component Value Date   HGB 14.0 09/15/2022   TIBC 335 09/15/2022   IRONPCTSAT 48 (H) 09/15/2022   FERRITIN 134 09/15/2022    Patient has stable slightly increased iron saturation, ferritin 134. LFT is stable, mildly increased AST which is chronic. No need for therapeutic phlebotomy at this time. Avoid iron supplementation, alcohol use. Continue observation and expectant management.  He agrees with the plan.  Orders Placed This Encounter  Procedures   CBC with Differential (Cancer Center Only)    Standing Status:   Future    Standing Expiration Date:   09/19/2023   CMP (Cancer Center only)    Standing Status:   Future    Standing Expiration Date:   09/19/2023   Iron and TIBC    Standing Status:   Future    Standing Expiration Date:   09/19/2023   Ferritin    Standing Status:   Future    Standing Expiration Date:   09/19/2023    All questions were answered. The patient knows to call the clinic with any problems, questions or concerns.  Rickard Patience, MD, PhD [facility] Hematology Oncology 09/19/2022    PERTINENT HEMATOLOGY HISTORY Cole Lane 47 y.o.  male with past medical history listed as per low who was referred by Dr. Birdie Sons for evaluation of heterozygous hemachromatosis mutation. Patient denies any family history of hemochromatosis. He is eating history his health state. He feels well at baseline except sometimes if he stands up pretty fast for dizziness. He has  been following up with primary care physician and has been noticed that his liver function tests has been mildly elevated. Additional workup showed high ferritin 353, also high transferrin saturation of 70%. 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 He is married and has no children yet.  He follows up with GI Dr.Anna and has ongoing work up.  # Discussed about avoiding alcohol, and uncooked seafood.  #Discussed about screening first degree relative for hereditary hemochromatosis.   INTERVAL HISTORY Sig Clisham is a 47 y.o. 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 Patient reports feeling well.   He has no new complaints.  Has gained some weight since last visit.   Review of Systems  Constitutional:  Negative for appetite change, chills, fatigue, fever and unexpected weight change.  HENT:   Negative for hearing loss and voice change.   Eyes:  Negative for eye problems and icterus.  Respiratory:  Negative for chest tightness, cough and shortness of breath.   Cardiovascular:  Negative for chest pain and leg swelling.  Gastrointestinal:  Negative for abdominal distention and abdominal pain.  Endocrine: Negative for hot flashes.  Genitourinary:  Negative for difficulty urinating, dysuria and frequency.   Musculoskeletal:  Negative for arthralgias.  Skin:  Negative for itching and rash.  Neurological:  Negative for light-headedness and numbness.  Hematological:  Negative for adenopathy. Does not bruise/bleed easily.  Psychiatric/Behavioral:  Negative for confusion.  MEDICAL HISTORY:  Past Medical History:  Diagnosis Date   Allergic rhinitis    Allergy    Asthma    Dysplastic nevus 05/24/2022   L post shoulder - moderate   Hemochromatosis    Kidney stone    Palpitation     SURGICAL  HISTORY: Past Surgical History:  Procedure Laterality Date   DERMOID CYST  EXCISION      SOCIAL HISTORY: Social History   Socioeconomic History   Marital status: Married    Spouse name: Not on file   Number of children: Not on file   Years of education: Not on file   Highest education level: Bachelor's degree (e.g., BA, AB, BS)  Occupational History   Not on file  Tobacco Use   Smoking status: Never   Smokeless tobacco: Never  Vaping Use   Vaping status: Never Used  Substance and Sexual Activity   Alcohol use: No   Drug use: No   Sexual activity: Yes  Other Topics Concern   Not on file  Social History Narrative   Not on file   Social Determinants of Health   Financial Resource Strain: Low Risk  (07/12/2022)   Overall Financial Resource Strain (CARDIA)    Difficulty of Paying Living Expenses: Not hard at all  Food Insecurity: No Food Insecurity (07/12/2022)   Hunger Vital Sign    Worried About Running Out of Food in the Last Year: Never true    Ran Out of Food in the Last Year: Never true  Transportation Needs: No Transportation Needs (07/12/2022)   PRAPARE - Administrator, Civil Service (Medical): No    Lack of Transportation (Non-Medical): No  Physical Activity: Insufficiently Active (07/12/2022)   Exercise Vital Sign    Days of Exercise per Week: 2 days    Minutes of Exercise per Session: 30 min  Stress: No Stress Concern Present (07/12/2022)   [employer] of Occupational Health - Occupational Stress Questionnaire    Feeling of Stress : Not at all  Social Connections: Moderately Isolated (07/12/2022)   Social Connection and Isolation Panel [NHANES]    Frequency of Communication with Friends and Family: More than three times a week    Frequency of Social Gatherings with Friends and Family: Once a week    Attends Religious Services: Never    Database administrator or Organizations: No    Attends Engineer, structural: Not on file     Marital Status: Married  Catering manager Violence: Not on file    FAMILY HISTORY: Family History  Problem Relation Age of Onset   Asthma Maternal Aunt    Hypertension Other        Parent   Stroke Other        Grandparent   Mental illness Other        Grandparent   Arthritis Other        Grandparent   Hypertension Father     ALLERGIES:  is allergic to penicillin g.  MEDICATIONS:  Current Outpatient Medications  Medication Sig Dispense Refill   cholecalciferol (VITAMIN D3) 25 MCG (1000 UNIT) tablet Take 1,000 Units by mouth daily. OVC     ibuprofen (ADVIL) 800 MG tablet Take 1 tablet (800 mg total) by mouth every 8 (eight) hours as needed. 21 tablet 0   No current facility-administered medications for this visit.      Marland Kitchen  PHYSICAL EXAMINATION: ECOG PERFORMANCE STATUS: 0 - Asymptomatic Vitals:   09/19/22 1307  BP: 108/82  Pulse: 74  Resp: 18  Temp: 98.7 F (37.1 C)   Filed Weights   09/19/22 1307  Weight: 163 lb 9.6 oz (74.2 kg)   Physical Exam Constitutional:      General: He is not in acute distress.    Appearance: He is not diaphoretic.  HENT:     Head: Normocephalic and atraumatic.  Eyes:     General: No scleral icterus. Neck:     Vascular: No JVD.  Cardiovascular:     Rate and Rhythm: Normal rate and regular rhythm.  Pulmonary:     Effort: Pulmonary effort is normal. No respiratory distress.     Breath sounds: Normal breath sounds. No wheezing.  Abdominal:     General: Bowel sounds are normal. There is no distension.     Palpations: Abdomen is soft. There is no mass.     Tenderness: There is no abdominal tenderness.  Musculoskeletal:        General: Normal range of motion.     Cervical back: Normal range of motion and neck supple.  Lymphadenopathy:     Cervical: No cervical adenopathy.  Skin:    General: Skin is warm and dry.     Findings: No erythema or rash.  Neurological:     Mental Status: He is alert and oriented to person, place, and  time. Mental status is at baseline.     Cranial Nerves: No cranial nerve deficit.     Motor: No abnormal muscle tone.  Psychiatric:        Mood and Affect: Mood and affect normal.    LABORATORY DATA:  I have reviewed the data as listed Lab Results  Component Value Date   WBC 5.7 09/15/2022   HGB 14.0 09/15/2022   HCT 42.3 09/15/2022   MCV 90.8 09/15/2022   PLT 192 09/15/2022   Recent Labs    01/30/22 0000 09/15/22 1254  NA 143 136  K 4.3 3.9  CL 105 102  CO2  --  25  GLUCOSE  --  87  BUN  --  12  CREATININE 0.9 0.76  CALCIUM 9.2 8.8*  GFRNONAA  --  >60  PROT  --  8.1  ALBUMIN 4.6 4.5  AST 44* 44*  ALT 34 34  ALKPHOS 89 65  BILITOT  --  1.1   Iron/TIBC/Ferritin/ %Sat    Component Value Date/Time   IRON 161 09/15/2022 1254   IRON 116 01/30/2022 0000   TIBC 335 09/15/2022 1254   FERRITIN 134 09/15/2022 1254   IRONPCTSAT 48 (H) 09/15/2022 1254   IRONPCTSAT 70 (H) 09/26/2016 1115   Hepatitis Panel all negative.  DNA Mutation Analysis                          RESULT: COMPOUND HETEROZYGOUS FOR THE C282Y AND H63D MUTATIONS

## 2022-09-27 ENCOUNTER — Encounter: Payer: Self-pay | Admitting: Family Medicine

## 2022-09-27 ENCOUNTER — Ambulatory Visit (INDEPENDENT_AMBULATORY_CARE_PROVIDER_SITE_OTHER): Payer: BC Managed Care – PPO | Admitting: Family Medicine

## 2022-09-27 VITALS — BP 106/70 | HR 85 | Temp 98.0°F | Ht 70.0 in | Wt 160.0 lb

## 2022-09-27 DIAGNOSIS — H6123 Impacted cerumen, bilateral: Secondary | ICD-10-CM | POA: Diagnosis not present

## 2022-09-27 DIAGNOSIS — G44229 Chronic tension-type headache, not intractable: Secondary | ICD-10-CM | POA: Diagnosis not present

## 2022-09-27 DIAGNOSIS — Z0001 Encounter for general adult medical examination with abnormal findings: Secondary | ICD-10-CM | POA: Diagnosis not present

## 2022-09-27 NOTE — Assessment & Plan Note (Addendum)
Physical exam completed.  Encouraged healthy diet and exercise.  Discussed getting back to cycling.  Discussed reducing eating out.  Patient was encouraged to get his flu vaccine.  Discussed patient was fairly low risk if he got COVID so it is up to him if he wanted further COVID vaccinations.  Lab work is up-to-date.  Discussed options to see an optometrist at another office for his vision issues.

## 2022-09-27 NOTE — Patient Instructions (Addendum)
Nice to see you. Please use Debrox for 3 days prior to your nurse visit for ear irrigation. Please try Claritin over-the-counter to see if that helps with your congestion and allergy symptoms.  If that is not beneficial please let me know and I can send in a nasal spray for you to use.

## 2022-09-27 NOTE — Assessment & Plan Note (Signed)
Chronic intermittent issue.  Suspect this is related to allergic rhinitis.  I encouraged him to try Claritin daily.  If this is not beneficial we could consider Astelin.

## 2022-09-27 NOTE — Progress Notes (Signed)
Marikay Alar, MD Phone: [phone]  Cole Lane is a 47 y.o. male who presents today for CPE.  Diet: Has been eating out more than he used to, does have soup, rice, chicken, and some fruits and vegetables.  Occasionally has snacks though they seem to be relatively healthy snacks.  No soda or sweet tea. Exercise: Some body weight exercises, cycle some, notes he has some big bike rides coming up and he needs to start training some. Colonoscopy: Not indicated Prostate cancer screening: Not indicated Family history-  Prostate cancer: no  Colon cancer: no Vaccines-   Flu: due  Tetanus: UTD  COVID19: x3 HIV screening: UTD Hep C Screening: UTD Tobacco use: no Alcohol use: no Illicit Drug use: no Dentist: yes Ophthalmology: yes, notes he is interested in switching eye doctors given some vision issues with his new glasses from his prior doctor  Sinus congestion/headaches: Patient notes he continues to have some headaches.  He still has some sinus congestion that does improve with change in head position at night.  He will have a couple of headaches a month.  He occasionally sneezes.  Occasional postnasal drip.  Cerumen impaction: Patient notes he still feels as though there is earwax in his left ear.  He did not use Debrox previously.  On ear irrigation previously it was difficult to get all of his earwax out.   Active Ambulatory Problems    Diagnosis Date Noted   Elevated LFTs 09/26/2016   Skin lesions 09/26/2016   Allergic rhinitis 09/26/2016   Vitamin D deficiency 09/26/2016   Hereditary hemochromatosis (HCC) 10/29/2016   Iron overload 10/29/2016   Headache 06/01/2017   Renal cyst 06/01/2017   Encounter for general adult medical examination with abnormal findings 09/22/2018   Memory difficulty 09/22/2018   Tinnitus 09/19/2019   Elevated LDL cholesterol level 10/24/2019   Multiple nevi 09/22/2020   Low back pain 09/23/2021   Dry mouth 09/23/2021   Cerumen impaction  07/12/2022   Resolved Ambulatory Problems    Diagnosis Date Noted   Palpitations 09/26/2016   Right foot pain 06/01/2017   Past Medical History:  Diagnosis Date   Allergy    Asthma    Dysplastic nevus 05/24/2022   Hemochromatosis    Kidney stone    Palpitation     Family History  Problem Relation Age of Onset   Asthma Maternal Aunt    Hypertension Other        Parent   Stroke Other        Grandparent   Mental illness Other        Grandparent   Arthritis Other        Grandparent   Hypertension Father    Cancer Paternal Uncle     Social History   Socioeconomic History   Marital status: Married    Spouse name: Not on file   Number of children: Not on file   Years of education: Not on file   Highest education level: Bachelor's degree (e.g., BA, AB, BS)  Occupational History   Not on file  Tobacco Use   Smoking status: Never   Smokeless tobacco: Never  Vaping Use   Vaping status: Never Used  Substance and Sexual Activity   Alcohol use: Never   Drug use: Never   Sexual activity: Yes    Birth control/protection: None  Other Topics Concern   Not on file  Social History Narrative   Not on file   Social Determinants of Health  Financial Resource Strain: Low Risk  (07/12/2022)   Overall Financial Resource Strain (CARDIA)    Difficulty of Paying Living Expenses: Not hard at all  Food Insecurity: No Food Insecurity (07/12/2022)   Hunger Vital Sign    Worried About Running Out of Food in the Last Year: Never true    Ran Out of Food in the Last Year: Never true  Transportation Needs: No Transportation Needs (07/12/2022)   PRAPARE - Administrator, Civil Service (Medical): No    Lack of Transportation (Non-Medical): No  Physical Activity: Insufficiently Active (07/12/2022)   Exercise Vital Sign    Days of Exercise per Week: 2 days    Minutes of Exercise per Session: 30 min  Stress: No Stress Concern Present (07/12/2022)   [employer] of  Occupational Health - Occupational Stress Questionnaire    Feeling of Stress : Not at all  Social Connections: Moderately Isolated (07/12/2022)   Social Connection and Isolation Panel [NHANES]    Frequency of Communication with Friends and Family: More than three times a week    Frequency of Social Gatherings with Friends and Family: Once a week    Attends Religious Services: Never    Database administrator or Organizations: No    Attends Engineer, structural: Not on file    Marital Status: Married  Catering manager Violence: Not on file    ROS  General:  Negative for nexplained weight loss, fever Skin: Negative for new or changing mole, sore that won't heal HEENT: Positive for trouble seeing, negative for trouble hearing, ringing in ears, mouth sores, hoarseness, change in voice, dysphagia. CV:  Negative for chest pain, dyspnea, edema, palpitations Resp: Negative for cough, dyspnea, hemoptysis GI: Negative for nausea, vomiting, diarrhea, constipation, abdominal pain, melena, hematochezia. GU: Negative for dysuria, incontinence, urinary hesitance, hematuria, vaginal or penile discharge, polyuria, sexual difficulty, lumps in testicle or breasts MSK: Negative for muscle cramps or aches, joint pain or swelling Neuro: Positive for headaches, negative for weakness, numbness, dizziness, passing out/fainting Psych: Negative for depression, anxiety, memory problems  Objective  Physical Exam Vitals:   09/27/22 0830  BP: 106/70  Pulse: 85  Temp: 98 F (36.7 C)  SpO2: 97%    BP Readings from Last 3 Encounters:  09/27/22 106/70  09/19/22 108/82  07/12/22 126/78   Wt Readings from Last 3 Encounters:  09/27/22 160 lb (72.6 kg)  09/19/22 163 lb 9.6 oz (74.2 kg)  07/12/22 163 lb 12.8 oz (74.3 kg)    Physical Exam Constitutional:      General: He is not in acute distress.    Appearance: He is not diaphoretic.  HENT:     Head: Normocephalic and atraumatic.     Right Ear:  Tympanic membrane normal.     Ears:     Comments: Left TM obscured by cerumen Cardiovascular:     Rate and Rhythm: Normal rate and regular rhythm.     Heart sounds: Normal heart sounds.  Pulmonary:     Effort: Pulmonary effort is normal.     Breath sounds: Normal breath sounds.  Abdominal:     General: Bowel sounds are normal. There is no distension.     Palpations: Abdomen is soft.     Tenderness: There is no abdominal tenderness.  Musculoskeletal:     Right lower leg: No edema.     Left lower leg: No edema.  Lymphadenopathy:     Cervical: No cervical adenopathy.  Skin:  General: Skin is warm and dry.  Neurological:     Mental Status: He is alert.  Psychiatric:        Mood and Affect: Mood normal.      Assessment/Plan:   Encounter for general adult medical examination with abnormal findings Assessment & Plan: Physical exam completed.  Encouraged healthy diet and exercise.  Discussed getting back to cycling.  Discussed reducing eating out.  Patient was encouraged to get his flu vaccine.  Discussed patient was fairly low risk if he got COVID so it is up to him if he wanted further COVID vaccinations.  Lab work is up-to-date.  Discussed options to see an optometrist at another office for his vision issues.   Chronic tension-type headache, not intractable Assessment & Plan: Chronic intermittent issue.  Suspect this is related to allergic rhinitis.  I encouraged him to try Claritin daily.  If this is not beneficial we could consider Astelin.   Bilateral impacted cerumen Assessment & Plan: Continues to have cerumen in his left ear canal.  Encouraged to use Debrox for 3 days prior to returning for ear irrigation with a nurse visit.     Return in about 1 week (around 10/04/2022) for Left ear irrigation nurse visit, 1 year PCP.   Marikay Alar, MD [facility]

## 2022-09-27 NOTE — Assessment & Plan Note (Signed)
Continues to have cerumen in his left ear canal.  Encouraged to use Debrox for 3 days prior to returning for ear irrigation with a nurse visit.

## 2022-10-05 ENCOUNTER — Ambulatory Visit (INDEPENDENT_AMBULATORY_CARE_PROVIDER_SITE_OTHER): Payer: BC Managed Care – PPO

## 2022-10-05 DIAGNOSIS — H6122 Impacted cerumen, left ear: Secondary | ICD-10-CM

## 2022-10-05 NOTE — Progress Notes (Signed)
Cerumen Impaction Patient presents with Wax Impaction in the left ear for the past 08 days. There is a prior history of cerumen impaction. The patient has been using ear drops to loosen wax immediately prior to this visit. The patient denies ear pain.  Before I began I placed a few drops of peroxide in ear and had patient lie down on opposite side. This step was done on the opposite ear as well.    Before I began flushing I checked in each ear so that I can track my progress. I had pt feel the water to see if it was too hot or too cold. Pt verbalized it was okay so I proceeded; periodically asking if patient was okay.    Process was completed & pt denied any pain or dizziness.  Ear were visualized one last time using otoscope prior to provider taking over.

## 2023-05-30 ENCOUNTER — Ambulatory Visit: Payer: BC Managed Care – PPO | Admitting: Dermatology

## 2023-06-19 ENCOUNTER — Ambulatory Visit: Admitting: Dermatology

## 2023-07-24 ENCOUNTER — Ambulatory Visit: Admitting: Dermatology

## 2023-07-24 ENCOUNTER — Encounter: Payer: Self-pay | Admitting: Dermatology

## 2023-07-24 DIAGNOSIS — L578 Other skin changes due to chronic exposure to nonionizing radiation: Secondary | ICD-10-CM

## 2023-07-24 DIAGNOSIS — L820 Inflamed seborrheic keratosis: Secondary | ICD-10-CM

## 2023-07-24 DIAGNOSIS — L821 Other seborrheic keratosis: Secondary | ICD-10-CM

## 2023-07-24 DIAGNOSIS — D489 Neoplasm of uncertain behavior, unspecified: Secondary | ICD-10-CM

## 2023-07-24 DIAGNOSIS — Z86018 Personal history of other benign neoplasm: Secondary | ICD-10-CM

## 2023-07-24 DIAGNOSIS — W908XXA Exposure to other nonionizing radiation, initial encounter: Secondary | ICD-10-CM

## 2023-07-24 DIAGNOSIS — D492 Neoplasm of unspecified behavior of bone, soft tissue, and skin: Secondary | ICD-10-CM

## 2023-07-24 DIAGNOSIS — D235 Other benign neoplasm of skin of trunk: Secondary | ICD-10-CM | POA: Diagnosis not present

## 2023-07-24 DIAGNOSIS — L738 Other specified follicular disorders: Secondary | ICD-10-CM

## 2023-07-24 DIAGNOSIS — Z1283 Encounter for screening for malignant neoplasm of skin: Secondary | ICD-10-CM | POA: Diagnosis not present

## 2023-07-24 DIAGNOSIS — D1801 Hemangioma of skin and subcutaneous tissue: Secondary | ICD-10-CM

## 2023-07-24 DIAGNOSIS — D225 Melanocytic nevi of trunk: Secondary | ICD-10-CM

## 2023-07-24 DIAGNOSIS — L814 Other melanin hyperpigmentation: Secondary | ICD-10-CM

## 2023-07-24 DIAGNOSIS — D229 Melanocytic nevi, unspecified: Secondary | ICD-10-CM

## 2023-07-24 NOTE — Patient Instructions (Addendum)

## 2023-07-24 NOTE — Progress Notes (Signed)
 Follow-Up Visit   Subjective  Cole Lane is a 48 y.o. male who presents for the following: Skin Cancer Screening and Full Body Skin Exam Hx of dysplastic, Reports some spots at face he would like checked.   The patient presents for Total-Body Skin Exam (TBSE) for skin cancer screening and mole check. The patient has spots, moles and lesions to be evaluated, some may be new or changing and the patient may have concern these could be cancer.  The following portions of the chart were reviewed this encounter and updated as appropriate: medications, allergies, medical history  Review of Systems:  No other skin or systemic complaints except as noted in HPI or Assessment and Plan.  Objective  Well appearing patient in no apparent distress; mood and affect are within normal limits.  A full examination was performed including scalp, head, eyes, ears, nose, lips, neck, chest, axillae, abdomen, back, buttocks, bilateral upper extremities, bilateral lower extremities, hands, feet, fingers, toes, fingernails, and toenails. All findings within normal limits unless otherwise noted below.   Relevant physical exam findings are noted in the Assessment and Plan.         face x 30 (30) Erythematous stuck-on, waxy papule or plaque right mid back 0.6 cm flesh colored papule    Assessment & Plan   SKIN CANCER SCREENING PERFORMED TODAY.  ACTINIC DAMAGE - Chronic condition, secondary to cumulative UV/sun exposure - diffuse scaly erythematous macules with underlying dyspigmentation - Recommend daily broad spectrum sunscreen SPF 30+ to sun-exposed areas, reapply every 2 hours as needed.  - Staying in the shade or wearing long sleeves, sun glasses (UVA+UVB protection) and wide brim hats (4-inch brim around the entire circumference of the hat) are also recommended for sun protection.  - Call for new or changing lesions.  LENTIGINES, SEBORRHEIC KERATOSES, HEMANGIOMAS - Benign normal skin  lesions - Benign-appearing - Call for any changes Sks at face   MELANOCYTIC NEVI - tan brown nevus at right posterior waistline see photos may consider bx in future based on pathology from bx today at right mid back  - Tan-brown and/or pink-flesh-colored symmetric macules and papules - Benign appearing on exam today - Observation - Call clinic for new or changing moles - Recommend daily use of broad spectrum spf 30+ sunscreen to sun-exposed areas.   Sebaceous Hyperplasia - Small yellow papules with a central dell - Benign-appearing - Observe. Call for changes.  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 Observe   Hx of Benign Keratosis at Right Superior Forehead.  Bx proven 05/24/2022 Benign. Observe   INFLAMED SEBORRHEIC KERATOSIS (30) face x 30 (30) Symptomatic, irritating, patient would like treated. Destruction of lesion - face x 30 (30) Complexity: simple   Destruction method: cryotherapy   Informed consent: discussed and consent obtained   Timeout:  patient name, date of birth, surgical site, and procedure verified Lesion destroyed using liquid nitrogen: Yes   Region frozen until ice ball extended beyond lesion: Yes   Outcome: patient tolerated procedure well with no complications   Post-procedure details: wound care instructions given   NEOPLASM OF UNCERTAIN BEHAVIOR right mid back Epidermal / dermal shaving  Lesion diameter (cm):  0.6 Informed consent: discussed and consent obtained   Timeout: patient name, date of birth, surgical site, and procedure verified   Procedure prep:  Patient was prepped and  draped in usual sterile fashion Prep type:  Isopropyl alcohol Anesthesia: the lesion was anesthetized in a standard fashion   Anesthetic:  1% lidocaine w/ epinephrine 1-100,000 buffered w/ 8.4% NaHCO3 Instrument used: flexible razor blade   Hemostasis achieved with: pressure, aluminum chloride and electrodesiccation   Outcome: patient tolerated procedure well   Post-procedure details: sterile dressing applied and wound care instructions given   Dressing type: bandage and petrolatum   Specimen 1 - Surgical pathology Differential Diagnosis: irritated nevus r/o dyplasia   Check Margins: yes Irregular nevus r/o dysplasia  Return in about 1 year (around 07/23/2024) for TBSE.  IRandee Busing, CMA, am acting as scribe for Celine Collard, MD.   Documentation: I have reviewed the above documentation for accuracy and completeness, and I agree with the above.  Celine Collard, MD

## 2023-07-26 LAB — SURGICAL PATHOLOGY

## 2023-07-27 ENCOUNTER — Ambulatory Visit: Payer: Self-pay | Admitting: Dermatology

## 2023-07-30 ENCOUNTER — Encounter: Payer: Self-pay | Admitting: Dermatology

## 2023-07-30 NOTE — Telephone Encounter (Signed)
-----   Message from Celine Collard sent at 07/27/2023 11:28 AM EDT ----- FINAL DIAGNOSIS        1. 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

## 2023-07-30 NOTE — Telephone Encounter (Signed)
Left message on voicemail to return my call.  

## 2023-07-30 NOTE — Telephone Encounter (Signed)
 Patient advised of BX results. aw

## 2023-09-07 ENCOUNTER — Inpatient Hospital Stay: Attending: Oncology

## 2023-09-07 LAB — CBC WITH DIFFERENTIAL (CANCER CENTER ONLY)
Abs Immature Granulocytes: 0.01 K/uL (ref 0.00–0.07)
Basophils Absolute: 0 K/uL (ref 0.0–0.1)
Basophils Relative: 1 %
Eosinophils Absolute: 0.1 K/uL (ref 0.0–0.5)
Eosinophils Relative: 1 %
HCT: 43.4 % (ref 39.0–52.0)
Hemoglobin: 14 g/dL (ref 13.0–17.0)
Immature Granulocytes: 0 %
Lymphocytes Relative: 31 %
Lymphs Abs: 1.6 K/uL (ref 0.7–4.0)
MCH: 29.4 pg (ref 26.0–34.0)
MCHC: 32.3 g/dL (ref 30.0–36.0)
MCV: 91 fL (ref 80.0–100.0)
Monocytes Absolute: 0.4 K/uL (ref 0.1–1.0)
Monocytes Relative: 8 %
Neutro Abs: 3 K/uL (ref 1.7–7.7)
Neutrophils Relative %: 59 %
Platelet Count: 202 K/uL (ref 150–400)
RBC: 4.77 MIL/uL (ref 4.22–5.81)
RDW: 11.5 % (ref 11.5–15.5)
WBC Count: 5 K/uL (ref 4.0–10.5)
nRBC: 0 % (ref 0.0–0.2)

## 2023-09-07 LAB — CMP (CANCER CENTER ONLY)
ALT: 26 U/L (ref 0–44)
AST: 42 U/L — ABNORMAL HIGH (ref 15–41)
Albumin: 4.8 g/dL (ref 3.5–5.0)
Alkaline Phosphatase: 73 U/L (ref 38–126)
Anion gap: 7 (ref 5–15)
BUN: 11 mg/dL (ref 6–20)
CO2: 27 mmol/L (ref 22–32)
Calcium: 9.3 mg/dL (ref 8.9–10.3)
Chloride: 104 mmol/L (ref 98–111)
Creatinine: 0.91 mg/dL (ref 0.61–1.24)
GFR, Estimated: >60 mL/min (ref >60)
Glucose, Bld: 103 mg/dL — ABNORMAL HIGH (ref 70–99)
Potassium: 4.4 mmol/L (ref 3.5–5.1)
Sodium: 138 mmol/L (ref 135–145)
Total Bilirubin: 1 mg/dL (ref 0.0–1.2)
Total Protein: 7.9 g/dL (ref 6.5–8.1)

## 2023-09-07 LAB — FERRITIN: Ferritin: 143 ng/mL (ref 24–336)

## 2023-09-07 LAB — IRON AND TIBC
Iron: 129 ug/dL (ref 45–182)
Saturation Ratios: 40 % — ABNORMAL HIGH (ref 17.9–39.5)
TIBC: 326 ug/dL (ref 250–450)
UIBC: 197 ug/dL

## 2023-09-14 ENCOUNTER — Other Ambulatory Visit: Payer: BC Managed Care – PPO

## 2023-09-18 ENCOUNTER — Encounter: Payer: Self-pay | Admitting: Oncology

## 2023-09-18 ENCOUNTER — Inpatient Hospital Stay: Payer: BC Managed Care – PPO

## 2023-09-18 ENCOUNTER — Inpatient Hospital Stay: Payer: BC Managed Care – PPO | Admitting: Oncology

## 2023-09-18 NOTE — Progress Notes (Signed)
No phlebotomy today.

## 2023-09-18 NOTE — Progress Notes (Signed)
 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  Labs are reviewed and discussed with patient. Lab Results  Component Value Date   HGB 14.0 09/07/2023   TIBC 326 09/07/2023   IRONPCTSAT 40 (H) 09/07/2023   FERRITIN 143 09/07/2023    Patient has stable slightly increased iron saturation, ferritin 134. LFT is stable, mildly increased AST which is chronic. No need for therapeutic phlebotomy at this time. Avoid iron supplementation, alcohol use. Continue observation and expectant management.  He agrees with the plan.  Orders Placed This Encounter  Procedures   CBC with Differential (Cancer Center Only)    Standing Status:   Future    Expected Date:   09/17/2024    Expiration Date:   09/17/2024   CMP (Cancer Center only)    Standing Status:   Future    Expected Date:   09/17/2024    Expiration Date:   09/17/2024   Iron and TIBC    Standing Status:   Future    Expected Date:   09/17/2024    Expiration Date:   09/17/2024   Ferritin    Standing Status:   Future    Expected Date:   09/17/2024    Expiration Date:   09/17/2024   Follow-up in 1 year All questions were answered. The patient knows to call the clinic with any problems, questions or concerns.  Call Babara, MD, PhD [facility] Hematology Oncology 09/18/2023    PERTINENT HEMATOLOGY HISTORY Cole Lane 48 y.o.  male with past medical history listed as per low who was referred by Dr. Maribeth for evaluation of heterozygous hemachromatosis mutation. Patient denies any family history of hemochromatosis. He is eating history his  health state. He feels well at baseline except sometimes if he stands up pretty fast for dizziness. He has been following up with primary care physician and has been noticed that his liver function tests has been mildly elevated. Additional workup showed high ferritin 353, also high transferrin saturation of 70%. 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 He is married and has no children yet.  He follows up with GI Dr.Anna and has ongoing work up.  # Discussed about avoiding alcohol, and uncooked seafood.  #Discussed about screening first degree relative for hereditary hemochromatosis.   INTERVAL HISTORY Cole Lane is a 48 y.o. 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 Patient reports feeling well.   He has no new complaints.    Review of Systems  Constitutional:  Negative for appetite change, chills, fatigue, fever and unexpected weight change.  HENT:   Negative for hearing loss and voice change.   Eyes:  Negative for eye problems and icterus.  Respiratory:  Negative for chest tightness, cough and shortness of breath.   Cardiovascular:  Negative for chest pain and leg swelling.  Gastrointestinal:  Negative for abdominal distention and abdominal pain.  Endocrine: Negative for hot flashes.  Genitourinary:  Negative for difficulty urinating, dysuria and frequency.   Musculoskeletal:  Negative for arthralgias.  Skin:  Negative for itching and rash.  Neurological:  Negative for light-headedness and numbness.  Hematological:  Negative for adenopathy. Does not bruise/bleed easily.  Psychiatric/Behavioral:  Negative for confusion.     MEDICAL HISTORY:  Past Medical History:  Diagnosis Date   Allergic rhinitis    Allergy    Asthma    Dysplastic nevus 05/24/2022   L post shoulder - moderate    Dysplastic nevus 07/24/2023   R mid back - mild   Hemochromatosis    Kidney stone    Palpitation     SURGICAL HISTORY: Past Surgical History:  Procedure Laterality Date   DERMOID CYST  EXCISION      SOCIAL HISTORY: Social History   Socioeconomic History   Marital status: Married    Spouse name: Not on file   Number of children: Not on file   Years of education: Not on file   Highest education level: Bachelor's degree (e.g., BA, AB, BS)  Occupational History   Not on file  Tobacco Use   Smoking status: Never   Smokeless tobacco: Never  Vaping Use   Vaping status: Never Used  Substance and Sexual Activity   Alcohol use: Never   Drug use: Never   Sexual activity: Yes    Birth control/protection: None  Other Topics Concern   Not on file  Social History Narrative   Not on file   Social Drivers of Health   Financial Resource Strain: Low Risk  (07/12/2022)   Overall Financial Resource Strain (CARDIA)    Difficulty of Paying Living Expenses: Not hard at all  Food Insecurity: No Food Insecurity (07/12/2022)   Hunger Vital Sign    Worried About Running Out of Food in the Last Year: Never true    Ran Out of Food in the Last Year: Never true  Transportation Needs: No Transportation Needs (07/12/2022)   PRAPARE - Administrator, Civil Service (Medical): No    Lack of Transportation (Non-Medical): No  Physical Activity: Insufficiently Active (07/12/2022)   Exercise Vital Sign    Days of Exercise per Week: 2 days    Minutes of Exercise per Session: 30 min  Stress: No Stress Concern Present (07/12/2022)   [employer] of Occupational Health - Occupational Stress Questionnaire    Feeling of Stress : Not at all  Social Connections: Moderately Isolated (07/12/2022)   Social Connection and Isolation Panel    Frequency of Communication with Friends and Family: More than three times a week    Frequency of Social Gatherings with Friends and Family: Once a week     Attends Religious Services: Never    Database administrator or Organizations: No    Attends Engineer, structural: Not on file    Marital Status: Married  Catering manager Violence: Not on file    FAMILY HISTORY: Family History  Problem Relation Age of Onset   Asthma Maternal Aunt    Hypertension Other        Parent   Stroke Other        Grandparent   Mental illness Other        Grandparent   Arthritis Other        Grandparent   Hypertension Father    Cancer Paternal Uncle     ALLERGIES:  is allergic to penicillin g.  MEDICATIONS:  Current Outpatient Medications  Medication Sig Dispense Refill   cholecalciferol (VITAMIN D3) 25 MCG (1000 UNIT) tablet Take 1,000 Units by mouth daily. OVC     ibuprofen  (  ADVIL ) 800 MG tablet Take 1 tablet (800 mg total) by mouth every 8 (eight) hours as needed. 21 tablet 0   No current facility-administered medications for this visit.      SABRA  PHYSICAL EXAMINATION: ECOG PERFORMANCE STATUS: 0 - Asymptomatic Vitals:   09/18/23 1312  BP: 118/88  Pulse: 63  Resp: 18  Temp: (!) 96.5 F (35.8 C)  SpO2: 99%   Filed Weights   09/18/23 1312  Weight: 159 lb 9.6 oz (72.4 kg)   Physical Exam Constitutional:      General: He is not in acute distress.    Appearance: He is not diaphoretic.  HENT:     Head: Normocephalic and atraumatic.  Eyes:     General: No scleral icterus. Neck:     Vascular: No JVD.  Cardiovascular:     Rate and Rhythm: Normal rate and regular rhythm.  Pulmonary:     Effort: Pulmonary effort is normal. No respiratory distress.     Breath sounds: Normal breath sounds. No wheezing.  Abdominal:     General: Bowel sounds are normal. There is no distension.     Palpations: Abdomen is soft. There is no mass.     Tenderness: There is no abdominal tenderness.  Musculoskeletal:        General: Normal range of motion.     Cervical back: Normal range of motion and neck supple.  Lymphadenopathy:     Cervical: No  cervical adenopathy.  Skin:    General: Skin is warm and dry.     Findings: No erythema or rash.  Neurological:     Mental Status: He is alert and oriented to person, place, and time. Mental status is at baseline.     Cranial Nerves: No cranial nerve deficit.     Motor: No abnormal muscle tone.  Psychiatric:        Mood and Affect: Mood and affect normal.    LABORATORY DATA:  I have reviewed the data as listed Lab Results  Component Value Date   WBC 5.0 09/07/2023   HGB 14.0 09/07/2023   HCT 43.4 09/07/2023   MCV 91.0 09/07/2023   PLT 202 09/07/2023   Recent Labs    09/07/23 0939  NA 138  K 4.4  CL 104  CO2 27  GLUCOSE 103*  BUN 11  CREATININE 0.91  CALCIUM 9.3  GFRNONAA >60  PROT 7.9  ALBUMIN 4.8  AST 42*  ALT 26  ALKPHOS 73  BILITOT 1.0   Iron/TIBC/Ferritin/ %Sat    Component Value Date/Time   IRON 129 09/07/2023 0939   IRON 116 01/30/2022 0000   TIBC 326 09/07/2023 0939   FERRITIN 143 09/07/2023 0939   IRONPCTSAT 40 (H) 09/07/2023 0939   IRONPCTSAT 70 (H) 09/26/2016 1115   Hepatitis Panel all negative.  DNA Mutation Analysis                          RESULT: COMPOUND HETEROZYGOUS FOR THE C282Y AND H63D MUTATIONS

## 2023-09-18 NOTE — Assessment & Plan Note (Signed)
 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  Labs are reviewed and discussed with patient. Lab Results  Component Value Date   HGB 14.0 09/07/2023   TIBC 326 09/07/2023   IRONPCTSAT 40 (H) 09/07/2023   FERRITIN 143 09/07/2023    Patient has stable slightly increased iron saturation, ferritin 134. LFT is stable, mildly increased AST which is chronic. No need for therapeutic phlebotomy at this time. Avoid iron supplementation, alcohol use. Continue observation and expectant management.  He agrees with the plan.

## 2023-09-28 ENCOUNTER — Encounter: Payer: BC Managed Care – PPO | Admitting: Family Medicine

## 2023-10-01 ENCOUNTER — Ambulatory Visit

## 2023-10-01 ENCOUNTER — Ambulatory Visit: Payer: Self-pay

## 2023-10-01 VITALS — BP 120/84 | HR 66 | Temp 97.7°F | Resp 20 | Ht 70.0 in | Wt 158.2 lb

## 2023-10-01 DIAGNOSIS — Z0000 Encounter for general adult medical examination without abnormal findings: Secondary | ICD-10-CM

## 2023-10-01 DIAGNOSIS — E7800 Pure hypercholesterolemia, unspecified: Secondary | ICD-10-CM | POA: Diagnosis not present

## 2023-10-01 DIAGNOSIS — Z131 Encounter for screening for diabetes mellitus: Secondary | ICD-10-CM

## 2023-10-01 DIAGNOSIS — H6123 Impacted cerumen, bilateral: Secondary | ICD-10-CM | POA: Diagnosis not present

## 2023-10-01 DIAGNOSIS — J3089 Other allergic rhinitis: Secondary | ICD-10-CM

## 2023-10-01 DIAGNOSIS — Z1211 Encounter for screening for malignant neoplasm of colon: Secondary | ICD-10-CM | POA: Diagnosis not present

## 2023-10-01 DIAGNOSIS — R7989 Other specified abnormal findings of blood chemistry: Secondary | ICD-10-CM

## 2023-10-01 DIAGNOSIS — E559 Vitamin D deficiency, unspecified: Secondary | ICD-10-CM

## 2023-10-01 LAB — LIPID PANEL
Cholesterol: 167 mg/dL (ref 0–200)
HDL: 49.9 mg/dL (ref >39.00)
LDL Cholesterol: 105 mg/dL — ABNORMAL HIGH (ref 0–99)
NonHDL: 117.22
Total CHOL/HDL Ratio: 3
Triglycerides: 62 mg/dL (ref 0.0–149.0)
VLDL: 12.4 mg/dL (ref 0.0–40.0)

## 2023-10-01 LAB — VITAMIN D 25 HYDROXY (VIT D DEFICIENCY, FRACTURES): VITD: 17.69 ng/mL — ABNORMAL LOW (ref 30.00–100.00)

## 2023-10-01 LAB — HEMOGLOBIN A1C: Hgb A1c MFr Bld: 5.6 % (ref 4.6–6.5)

## 2023-10-01 MED ORDER — VITAMIN D (ERGOCALCIFEROL) 1.25 MG (50000 UNIT) PO CAPS
50000.0000 [IU] | ORAL_CAPSULE | ORAL | 3 refills | Status: AC
Start: 2023-10-01 — End: ?

## 2023-10-01 NOTE — Progress Notes (Addendum)
 Established Patient Office Visit TOC from Dr. Maribeth Annual Physical    Subjective  Patient ID: Cole Lane, male    DOB: 17-Jun-1975  Age: 48 y.o. MRN: [id number]  Chief Complaint  Patient presents with   Establish Care    Transferring from Dr. Maribeth    He  has a past medical history of Allergic rhinitis, Asthma, Dry mouth (09/23/2021), Dysplastic nevus (05/24/2022), Dysplastic nevus (07/24/2023), Hemochromatosis, Kidney stone, Low back pain (09/23/2021), Memory difficulty (09/22/2018), and Palpitation.  HPI Discussed the use of AI scribe software for clinical note transcription with the patient, who gave verbal consent to proceed.  History of Present Illness Cole Lane is a 48 year old male who presents for an annual physical exam.  - He has a history of hereditary hemochromatosis diagnosed approximately six to seven years ago. Initially, he underwent several phlebotomies which significantly reduced his iron levels. He is established with heme/onc Dr. Babara and follows up with her once a year.   - He experiences ongoing nasal congestion for the past 15 to 20 years. Congestion occurs without rhinorrhea, except occasionally after eating, particularly hot or spicy foods. He has tried Flonase  in the past, but it caused epistaxis, so he discontinued its use. He occasionally uses Claritin during the spring for seasonal allergies but has not used it recently. He experiences headaches approximately once every two weeks, which he attributes to congestion, and manages these with over-the-counter ibuprofen , taking 200 mg as needed, and occasionally requires a second dose.   - He gets b/l impacted earwax and has seen ENT in the past for this. He was told he has deviated septum in the past.   - He has a history of low vitamin D  levels. He previously took over-the-counter vitamin D  supplements, which did not improve his levels. He was then prescribed a higher dose of vitamin  D, which normalized his levels. He struggles with taking vitamin D  regularly and suspects his levels may be low again.  - He has a history of mildly elevated LDL cholesterol. He does not exercise regularly but occasionally engages in bodyweight exercises and cycling.  - He has a history of borderline elevated AST levels, which have remained stable for the past 14 years. He does not consume alcohol.  - Preventative:  He is due for colorectal cancer screening.     ROS As per HPI    Objective:     BP 120/84   Pulse 66   Temp 97.7 F (36.5 C)   Resp 20   Ht 5' 10 (1.778 m)   Wt 158 lb 4 oz (71.8 kg)   SpO2 99%   BMI 22.71 kg/m      10/01/2023   10:59 AM 09/27/2022    8:31 AM 07/12/2022   10:58 AM  Depression screen PHQ 2/9  Decreased Interest 0 0 0  Down, Depressed, Hopeless 0 0 0  PHQ - 2 Score 0 0 0  Altered sleeping 0 0 0  Tired, decreased energy 1 0 0  Change in appetite 0 0 0  Feeling bad or failure about yourself  0 0 0  Trouble concentrating 0 0 0  Moving slowly or fidgety/restless 0 0 0  Suicidal thoughts 0 0 0  PHQ-9 Score 1 0 0  Difficult doing work/chores Not difficult at all Not difficult at all Not difficult at all      10/01/2023   11:00 AM 09/27/2022    8:32 AM 07/12/2022   10:58  AM 04/14/2022   11:50 AM  GAD 7 : Generalized Anxiety Score  Nervous, Anxious, on Edge 0 0 0 0  Control/stop worrying 0 0 0 0  Worry too much - different things 0 0 0 0  Trouble relaxing 0 0 0 0  Restless 0 0 0 0  Easily annoyed or irritable 0 0 0 0  Afraid - awful might happen 0 0 0 0  Total GAD 7 Score 0 0 0 0  Anxiety Difficulty Not difficult at all Not difficult at all Not difficult at all       10/01/2023   10:59 AM 09/27/2022    8:31 AM 07/12/2022   10:58 AM  Depression screen PHQ 2/9  Decreased Interest 0 0 0  Down, Depressed, Hopeless 0 0 0  PHQ - 2 Score 0 0 0  Altered sleeping 0 0 0  Tired, decreased energy 1 0 0  Change in appetite 0 0 0  Feeling bad or  failure about yourself  0 0 0  Trouble concentrating 0 0 0  Moving slowly or fidgety/restless 0 0 0  Suicidal thoughts 0 0 0  PHQ-9 Score 1 0 0  Difficult doing work/chores Not difficult at all Not difficult at all Not difficult at all      10/01/2023   11:00 AM 09/27/2022    8:32 AM 07/12/2022   10:58 AM 04/14/2022   11:50 AM  GAD 7 : Generalized Anxiety Score  Nervous, Anxious, on Edge 0 0 0 0  Control/stop worrying 0 0 0 0  Worry too much - different things 0 0 0 0  Trouble relaxing 0 0 0 0  Restless 0 0 0 0  Easily annoyed or irritable 0 0 0 0  Afraid - awful might happen 0 0 0 0  Total GAD 7 Score 0 0 0 0  Anxiety Difficulty Not difficult at all Not difficult at all Not difficult at all    SDOH Screenings   Food Insecurity: No Food Insecurity (07/12/2022)  Housing: Low Risk  (07/12/2022)  Transportation Needs: No Transportation Needs (07/12/2022)  Depression (PHQ2-9): Low Risk  (10/01/2023)  Financial Resource Strain: Low Risk  (07/12/2022)  Physical Activity: Insufficiently Active (07/12/2022)  Social Connections: Moderately Isolated (07/12/2022)  Stress: No Stress Concern Present (07/12/2022)  Tobacco Use: Low Risk  (10/01/2023)     Physical Exam Constitutional:      General: He is not in acute distress.    Appearance: He is normal weight. He is not ill-appearing.  HENT:     Head: Normocephalic and atraumatic.     Right Ear: Tympanic membrane and external ear normal. There is no impacted cerumen.     Left Ear: Tympanic membrane and external ear normal. There is no impacted cerumen.     Mouth/Throat:     Mouth: Mucous membranes are moist.     Pharynx: Oropharynx is clear.  Cardiovascular:     Rate and Rhythm: Normal rate.  Pulmonary:     Effort: Pulmonary effort is normal.     Breath sounds: Normal breath sounds.  Abdominal:     Palpations: Abdomen is soft.     Tenderness: There is no guarding.  Musculoskeletal:     Cervical back: Neck supple.     Right lower leg:  No edema.     Left lower leg: No edema.  Lymphadenopathy:     Cervical: No cervical adenopathy.  Neurological:     Mental Status: He is alert and oriented  to person, place, and time.  Psychiatric:        Mood and Affect: Mood normal.        No results found for any visits on 10/01/23.  The 10-year ASCVD risk score (Arnett DK, et al., 2019) is: 1.4%* (Cholesterol units were assumed)     Assessment & Plan:   Annual physical exam Assessment & Plan: Physical exam completed.  Encouraged continue healthy diet and increase moderate intensity exercise for 30 min each day for 5 days a week or at least 150 min total per week. Continue annual eye exam.  Due for screening colonoscopy, ordered today.  Check A1c to screen for hyperglycemia.    Vitamin D  insufficiency Assessment & Plan: Check vitamin D  level today. If low recommend starting 50000 units once weekly vitamin D  otherwise continue OTC 2000 units vitamin D  daily.    Orders: -     VITAMIN D  25 Hydroxy (Vit-D Deficiency, Fractures)  Elevated LDL cholesterol level Assessment & Plan: Check fasting lipid panel today. Otherwise plan per annual physical exam.   Orders: -     Lipid panel  Encounter for screening examination for intermediate hyperglycemia and diabetes mellitus Assessment & Plan: Physical exam completed.  Encouraged continue healthy diet and increase moderate intensity exercise for 30 min each day for 5 days a week or at least 150 min total per week. Continue annual eye exam.  Due for screening colonoscopy, ordered today.  Check A1c to screen for hyperglycemia.   Orders: -     Hemoglobin A1c  Screening for colon cancer Assessment & Plan: Physical exam completed.  Encouraged continue healthy diet and increase moderate intensity exercise for 30 min each day for 5 days a week or at least 150 min total per week. Continue annual eye exam.  Due for screening colonoscopy, ordered today.  Check A1c to screen  for hyperglycemia.   Orders: -     Ambulatory referral to Gastroenterology  Non-seasonal allergic rhinitis, unspecified trigger Assessment & Plan: - Use nasal saline rinses before nose sprays such as with Neilmed Sinus Rinse.   - Use nasal steroid spray (Flonase  sensimist 1 sprays in each nostril) daily for 7 days then as needed.  - Use antihistamine medication like Zyrtec 10 mg or Allegra  180mg  or Xyzal 5mg  daily as needed.  - Consider ENT referral if symptoms persists.    Bilateral impacted cerumen Assessment & Plan: Recurrent in the past. Not noted today on the exam.     Elevated LFTs Assessment & Plan: Mildly elevated AST. Likely related to hereditary hemochromatosis. Reviewed result from 09/18/23. Continue periodic monitoring of LFT.      Return in about 1 year (around 09/30/2024) for Annual physical .   Stevenson Windmiller, MD

## 2023-10-01 NOTE — Assessment & Plan Note (Signed)
 Recurrent in the past. Not noted today on the exam.

## 2023-10-01 NOTE — Assessment & Plan Note (Addendum)
 Check vitamin D  level today. If low recommend starting 50000 units once weekly vitamin D  otherwise continue OTC 2000 units vitamin D  daily.

## 2023-10-01 NOTE — Assessment & Plan Note (Addendum)
 Mildly elevated AST. Likely related to hereditary hemochromatosis. Reviewed result from 09/18/23. Continue periodic monitoring of LFT.

## 2023-10-01 NOTE — Assessment & Plan Note (Signed)
-   Use nasal saline rinses before nose sprays such as with Neilmed Sinus Rinse.   - Use nasal steroid spray (Flonase  sensimist 1 sprays in each nostril) daily for 7 days then as needed.  - Use antihistamine medication like Zyrtec 10 mg or Allegra  180mg  or Xyzal 5mg  daily as needed.  - Consider ENT referral if symptoms persists.

## 2023-10-01 NOTE — Assessment & Plan Note (Signed)
 Check fasting lipid panel today. Otherwise plan per annual physical exam.

## 2023-10-01 NOTE — Patient Instructions (Signed)
-   Use nasal saline rinses before nose sprays such as with Neilmed Sinus Rinse.   - Use nasal steroid spray (Flonase  sensimist 1 sprays in each nostril) daily for 7 days.  - Use antihistamine medication like Zyrtec 10 mg or Allegra  180mg  or Xyzal 5mg  daily as need.

## 2023-10-01 NOTE — Assessment & Plan Note (Addendum)
 Physical exam completed.  Encouraged continue healthy diet and increase moderate intensity exercise for 30 min each day for 5 days a week or at least 150 min total per week. Continue annual eye exam.  Due for screening colonoscopy, ordered today.  Check A1c to screen for hyperglycemia.

## 2024-07-24 ENCOUNTER — Ambulatory Visit: Admitting: Dermatology

## 2024-09-16 ENCOUNTER — Other Ambulatory Visit

## 2024-09-17 ENCOUNTER — Ambulatory Visit: Admitting: Oncology

## 2024-10-02 ENCOUNTER — Encounter

## 2024-10-07 ENCOUNTER — Encounter
